# Patient Record
Sex: Male | Born: 1990
Health system: Southern US, Community
[De-identification: ages and names within clinical notes are randomized; demographics above are authoritative.]

## PROBLEM LIST (undated history)

## (undated) HISTORY — PX: TESTICLE TORSION REDUCTION: SHX795

---

## 2015-08-06 DIAGNOSIS — W3400XA Accidental discharge from unspecified firearms or gun, initial encounter: Secondary | ICD-10-CM

## 2015-08-06 HISTORY — DX: Accidental discharge from unspecified firearms or gun, initial encounter: W34.00XA

## 2015-12-22 ENCOUNTER — Inpatient Hospital Stay (HOSPITAL_COMMUNITY): Payer: Medicaid Other

## 2015-12-22 ENCOUNTER — Inpatient Hospital Stay (HOSPITAL_COMMUNITY): Payer: Medicaid Other | Admitting: Anesthesiology

## 2015-12-22 ENCOUNTER — Emergency Department (HOSPITAL_COMMUNITY): Payer: Medicaid Other

## 2015-12-22 ENCOUNTER — Encounter (HOSPITAL_COMMUNITY): Admission: EM | Disposition: A | Discharge status: Rehabilitation Facility | Payer: Self-pay | Source: Home / Self Care

## 2015-12-22 ENCOUNTER — Inpatient Hospital Stay (HOSPITAL_COMMUNITY)
Admission: EM | Admit: 2015-12-22 | Discharge: 2016-01-02 | DRG: 958 | Disposition: A | Discharge status: Rehabilitation Facility | Payer: Medicaid Other | Attending: General Surgery | Admitting: General Surgery

## 2015-12-22 ENCOUNTER — Encounter (HOSPITAL_COMMUNITY): Payer: Self-pay

## 2015-12-22 DIAGNOSIS — Z419 Encounter for procedure for purposes other than remedying health state, unspecified: Secondary | ICD-10-CM

## 2015-12-22 DIAGNOSIS — D696 Thrombocytopenia, unspecified: Secondary | ICD-10-CM | POA: Diagnosis not present

## 2015-12-22 DIAGNOSIS — G8220 Paraplegia, unspecified: Secondary | ICD-10-CM | POA: Diagnosis present

## 2015-12-22 DIAGNOSIS — D62 Acute posthemorrhagic anemia: Secondary | ICD-10-CM | POA: Diagnosis not present

## 2015-12-22 DIAGNOSIS — N319 Neuromuscular dysfunction of bladder, unspecified: Secondary | ICD-10-CM | POA: Diagnosis present

## 2015-12-22 DIAGNOSIS — S22029A Unspecified fracture of second thoracic vertebra, initial encounter for closed fracture: Secondary | ICD-10-CM | POA: Diagnosis present

## 2015-12-22 DIAGNOSIS — K5900 Constipation, unspecified: Secondary | ICD-10-CM | POA: Diagnosis not present

## 2015-12-22 DIAGNOSIS — S22029B Unspecified fracture of second thoracic vertebra, initial encounter for open fracture: Secondary | ICD-10-CM | POA: Diagnosis present

## 2015-12-22 DIAGNOSIS — F419 Anxiety disorder, unspecified: Secondary | ICD-10-CM | POA: Diagnosis present

## 2015-12-22 DIAGNOSIS — J9600 Acute respiratory failure, unspecified whether with hypoxia or hypercapnia: Secondary | ICD-10-CM

## 2015-12-22 DIAGNOSIS — S24151A Other incomplete lesion at T1 level of thoracic spinal cord, initial encounter: Secondary | ICD-10-CM | POA: Diagnosis present

## 2015-12-22 DIAGNOSIS — S41101A Unspecified open wound of right upper arm, initial encounter: Secondary | ICD-10-CM | POA: Diagnosis present

## 2015-12-22 DIAGNOSIS — S27321A Contusion of lung, unilateral, initial encounter: Secondary | ICD-10-CM | POA: Diagnosis present

## 2015-12-22 DIAGNOSIS — Z9889 Other specified postprocedural states: Secondary | ICD-10-CM | POA: Diagnosis not present

## 2015-12-22 DIAGNOSIS — S22019B Unspecified fracture of first thoracic vertebra, initial encounter for open fracture: Secondary | ICD-10-CM | POA: Diagnosis present

## 2015-12-22 DIAGNOSIS — S22019A Unspecified fracture of first thoracic vertebra, initial encounter for closed fracture: Secondary | ICD-10-CM | POA: Diagnosis present

## 2015-12-22 DIAGNOSIS — W3400XA Accidental discharge from unspecified firearms or gun, initial encounter: Secondary | ICD-10-CM

## 2015-12-22 DIAGNOSIS — E876 Hypokalemia: Secondary | ICD-10-CM | POA: Diagnosis not present

## 2015-12-22 DIAGNOSIS — S41131A Puncture wound without foreign body of right upper arm, initial encounter: Secondary | ICD-10-CM

## 2015-12-22 DIAGNOSIS — D72829 Elevated white blood cell count, unspecified: Secondary | ICD-10-CM | POA: Diagnosis not present

## 2015-12-22 DIAGNOSIS — Z880 Allergy status to penicillin: Secondary | ICD-10-CM

## 2015-12-22 DIAGNOSIS — S41139A Puncture wound without foreign body of unspecified upper arm, initial encounter: Secondary | ICD-10-CM

## 2015-12-22 DIAGNOSIS — S2231XA Fracture of one rib, right side, initial encounter for closed fracture: Secondary | ICD-10-CM | POA: Diagnosis present

## 2015-12-22 DIAGNOSIS — T1490XA Injury, unspecified, initial encounter: Secondary | ICD-10-CM

## 2015-12-22 DIAGNOSIS — S272XXA Traumatic hemopneumothorax, initial encounter: Secondary | ICD-10-CM | POA: Diagnosis present

## 2015-12-22 DIAGNOSIS — M5489 Other dorsalgia: Secondary | ICD-10-CM | POA: Diagnosis present

## 2015-12-22 DIAGNOSIS — R0682 Tachypnea, not elsewhere classified: Secondary | ICD-10-CM

## 2015-12-22 HISTORY — PX: THORACIC LAMINECTOMY FOR EPIDURAL ABSCESS: SHX6115

## 2015-12-22 LAB — COMPREHENSIVE METABOLIC PANEL
ALT: 13 U/L — AB (ref 17–63)
ANION GAP: 8 (ref 5–15)
AST: 20 U/L (ref 15–41)
Albumin: 3 g/dL — ABNORMAL LOW (ref 3.5–5.0)
Alkaline Phosphatase: 42 U/L (ref 38–126)
BUN: 11 mg/dL (ref 6–20)
CHLORIDE: 117 mmol/L — AB (ref 101–111)
CO2: 18 mmol/L — ABNORMAL LOW (ref 22–32)
CREATININE: 0.63 mg/dL (ref 0.61–1.24)
Calcium: 7.4 mg/dL — ABNORMAL LOW (ref 8.9–10.3)
Glucose, Bld: 93 mg/dL (ref 65–99)
POTASSIUM: 3.3 mmol/L — AB (ref 3.5–5.1)
SODIUM: 143 mmol/L (ref 135–145)
Total Bilirubin: 0.6 mg/dL (ref 0.3–1.2)
Total Protein: 5.3 g/dL — ABNORMAL LOW (ref 6.5–8.1)

## 2015-12-22 LAB — URINALYSIS, ROUTINE W REFLEX MICROSCOPIC
BILIRUBIN URINE: NEGATIVE
GLUCOSE, UA: NEGATIVE mg/dL
Hgb urine dipstick: NEGATIVE
KETONES UR: NEGATIVE mg/dL
Leukocytes, UA: NEGATIVE
NITRITE: NEGATIVE
PH: 7 (ref 5.0–8.0)
Protein, ur: NEGATIVE mg/dL
Specific Gravity, Urine: >1.046 — ABNORMAL HIGH (ref 1.005–1.030)

## 2015-12-22 LAB — I-STAT CG4 LACTIC ACID, ED: Lactic Acid, Venous: 2.12 mmol/L (ref 0.5–2.0)

## 2015-12-22 LAB — MRSA PCR SCREENING: MRSA BY PCR: NEGATIVE

## 2015-12-22 LAB — PROTIME-INR
INR: 1.08 (ref 0.00–1.49)
Prothrombin Time: 14.2 seconds (ref 11.6–15.2)

## 2015-12-22 LAB — I-STAT CHEM 8, ED
BUN: 13 mg/dL (ref 6–20)
CALCIUM ION: 0.88 mmol/L — AB (ref 1.12–1.23)
CREATININE: 0.5 mg/dL — AB (ref 0.61–1.24)
Chloride: 113 mmol/L — ABNORMAL HIGH (ref 101–111)
GLUCOSE: 89 mg/dL (ref 65–99)
HCT: 43 % (ref 39.0–52.0)
HEMOGLOBIN: 14.6 g/dL (ref 13.0–17.0)
Potassium: 3.2 mmol/L — ABNORMAL LOW (ref 3.5–5.1)
Sodium: 146 mmol/L — ABNORMAL HIGH (ref 135–145)
TCO2: 15 mmol/L (ref 0–100)

## 2015-12-22 LAB — POCT I-STAT 7, (LYTES, BLD GAS, ICA,H+H)
BICARBONATE: 24.4 meq/L — AB (ref 20.0–24.0)
Calcium, Ion: 1.17 mmol/L (ref 1.12–1.23)
HCT: 42 % (ref 39.0–52.0)
Hemoglobin: 14.3 g/dL (ref 13.0–17.0)
O2 SAT: 100 %
PH ART: 7.408 (ref 7.350–7.450)
PO2 ART: 196 mmHg — AB (ref 80.0–100.0)
POTASSIUM: 4.1 mmol/L (ref 3.5–5.1)
SODIUM: 140 mmol/L (ref 135–145)
TCO2: 26 mmol/L (ref 0–100)
pCO2 arterial: 38.5 mmHg (ref 35.0–45.0)

## 2015-12-22 LAB — PREPARE FRESH FROZEN PLASMA
UNIT DIVISION: 0
Unit division: 0

## 2015-12-22 LAB — CBC
HCT: 41.3 % (ref 39.0–52.0)
Hemoglobin: 13.7 g/dL (ref 13.0–17.0)
MCH: 30.5 pg (ref 26.0–34.0)
MCHC: 33.2 g/dL (ref 30.0–36.0)
MCV: 92 fL (ref 78.0–100.0)
PLATELETS: 153 10*3/uL (ref 150–400)
RBC: 4.49 MIL/uL (ref 4.22–5.81)
RDW: 13.4 % (ref 11.5–15.5)
WBC: 7.9 10*3/uL (ref 4.0–10.5)

## 2015-12-22 LAB — ETHANOL: Alcohol, Ethyl (B): <5 mg/dL (ref <5)

## 2015-12-22 LAB — ABO/RH: ABO/RH(D): B POS

## 2015-12-22 SURGERY — THORACIC LAMINECTOMY FOR EPIDURAL ABSCESS
Anesthesia: General | Site: Back | Laterality: Right

## 2015-12-22 MED ORDER — BACITRACIN ZINC 500 UNIT/GM EX OINT
TOPICAL_OINTMENT | Freq: Two times a day (BID) | CUTANEOUS | Status: DC
  Administered 2015-12-23: 1 via TOPICAL
  Administered 2015-12-23: 11:00:00 via TOPICAL
  Administered 2015-12-24: 1 via TOPICAL
  Administered 2015-12-24 – 2015-12-29 (×9): via TOPICAL
  Administered 2015-12-29: 1 via TOPICAL
  Administered 2015-12-30: 10:00:00 via TOPICAL
  Administered 2015-12-30: 1 via TOPICAL
  Administered 2015-12-31 – 2016-01-02 (×4): via TOPICAL
  Filled 2015-12-22 (×2): qty 28.35

## 2015-12-22 MED ORDER — SUCCINYLCHOLINE CHLORIDE 200 MG/10ML IV SOSY
PREFILLED_SYRINGE | INTRAVENOUS | Status: AC
  Filled 2015-12-22: qty 10

## 2015-12-22 MED ORDER — HYDROMORPHONE HCL 1 MG/ML IJ SOLN
0.5000 mg | INTRAMUSCULAR | Status: DC | PRN
  Administered 2015-12-22 – 2016-01-02 (×9): 0.5 mg via INTRAVENOUS
  Filled 2015-12-22 (×10): qty 1

## 2015-12-22 MED ORDER — SUCCINYLCHOLINE CHLORIDE 20 MG/ML IJ SOLN
INTRAMUSCULAR | Status: DC | PRN
  Administered 2015-12-22: 130 mg via INTRAVENOUS

## 2015-12-22 MED ORDER — ONDANSETRON HCL 4 MG/2ML IJ SOLN
INTRAMUSCULAR | Status: DC | PRN
  Administered 2015-12-22: 4 mg via INTRAVENOUS

## 2015-12-22 MED ORDER — MORPHINE SULFATE (PF) 2 MG/ML IV SOLN
INTRAVENOUS | Status: AC
  Administered 2015-12-22: 2 mg via INTRAVENOUS
  Filled 2015-12-22: qty 1

## 2015-12-22 MED ORDER — ONDANSETRON HCL 4 MG/2ML IJ SOLN
INTRAMUSCULAR | Status: AC
  Filled 2015-12-22: qty 2

## 2015-12-22 MED ORDER — FENTANYL CITRATE (PF) 250 MCG/5ML IJ SOLN
INTRAMUSCULAR | Status: AC
  Filled 2015-12-22: qty 5

## 2015-12-22 MED ORDER — PANTOPRAZOLE SODIUM 40 MG IV SOLR
40.0000 mg | Freq: Every day | INTRAVENOUS | Status: DC
  Administered 2015-12-23 – 2015-12-24 (×2): 40 mg via INTRAVENOUS
  Filled 2015-12-22 (×2): qty 40

## 2015-12-22 MED ORDER — ALBUTEROL SULFATE HFA 108 (90 BASE) MCG/ACT IN AERS
INHALATION_SPRAY | RESPIRATORY_TRACT | Status: DC | PRN
  Administered 2015-12-22: 2 via RESPIRATORY_TRACT

## 2015-12-22 MED ORDER — ONDANSETRON HCL 4 MG/2ML IJ SOLN
4.0000 mg | Freq: Four times a day (QID) | INTRAMUSCULAR | Status: DC | PRN
  Filled 2015-12-22: qty 2

## 2015-12-22 MED ORDER — VANCOMYCIN HCL IN DEXTROSE 1-5 GM/200ML-% IV SOLN
INTRAVENOUS | Status: AC
  Filled 2015-12-22: qty 200

## 2015-12-22 MED ORDER — HYDROMORPHONE HCL 1 MG/ML IJ SOLN
2.0000 mg | Freq: Once | INTRAMUSCULAR | Status: AC
  Administered 2015-12-22: 2 mg via INTRAVENOUS
  Filled 2015-12-22: qty 2

## 2015-12-22 MED ORDER — TETANUS-DIPHTH-ACELL PERTUSSIS 5-2.5-18.5 LF-MCG/0.5 IM SUSP
0.5000 mL | Freq: Once | INTRAMUSCULAR | Status: AC
Start: 2015-12-22 — End: 2015-12-22
  Administered 2015-12-22: 0.5 mL via INTRAMUSCULAR

## 2015-12-22 MED ORDER — 0.9 % SODIUM CHLORIDE (POUR BTL) OPTIME
TOPICAL | Status: DC | PRN
  Administered 2015-12-22: 1000 mL

## 2015-12-22 MED ORDER — MIDAZOLAM HCL 5 MG/5ML IJ SOLN
INTRAMUSCULAR | Status: DC | PRN
  Administered 2015-12-22: 2 mg via INTRAVENOUS

## 2015-12-22 MED ORDER — DOCUSATE SODIUM 100 MG PO CAPS
100.0000 mg | ORAL_CAPSULE | Freq: Two times a day (BID) | ORAL | Status: DC
  Administered 2015-12-24 – 2016-01-02 (×20): 100 mg via ORAL
  Filled 2015-12-22 (×20): qty 1

## 2015-12-22 MED ORDER — GABAPENTIN 300 MG PO CAPS
300.0000 mg | ORAL_CAPSULE | Freq: Three times a day (TID) | ORAL | Status: DC
  Administered 2015-12-24 – 2015-12-27 (×10): 300 mg via ORAL
  Filled 2015-12-22 (×10): qty 1

## 2015-12-22 MED ORDER — PHENYLEPHRINE 40 MCG/ML (10ML) SYRINGE FOR IV PUSH (FOR BLOOD PRESSURE SUPPORT)
PREFILLED_SYRINGE | INTRAVENOUS | Status: AC
  Filled 2015-12-22: qty 10

## 2015-12-22 MED ORDER — PROPOFOL 10 MG/ML IV BOLUS
INTRAVENOUS | Status: AC
  Filled 2015-12-22: qty 40

## 2015-12-22 MED ORDER — SODIUM CHLORIDE 0.9 % IV SOLN
INTRAVENOUS | Status: AC | PRN
  Administered 2015-12-22: 150 mL/h via INTRAVENOUS

## 2015-12-22 MED ORDER — BUPIVACAINE-EPINEPHRINE (PF) 0.5% -1:200000 IJ SOLN
INTRAMUSCULAR | Status: DC | PRN
  Administered 2015-12-22: 10 mL

## 2015-12-22 MED ORDER — POLYETHYLENE GLYCOL 3350 17 G PO PACK
17.0000 g | PACK | Freq: Every day | ORAL | Status: DC
  Administered 2015-12-24 – 2016-01-02 (×10): 17 g via ORAL
  Filled 2015-12-22 (×10): qty 1

## 2015-12-22 MED ORDER — CEFAZOLIN SODIUM-DEXTROSE 2-4 GM/100ML-% IV SOLN
2.0000 g | Freq: Once | INTRAVENOUS | Status: AC
  Administered 2015-12-22: 2 g via INTRAVENOUS

## 2015-12-22 MED ORDER — HYDROMORPHONE HCL 1 MG/ML IJ SOLN
INTRAMUSCULAR | Status: AC
  Filled 2015-12-22: qty 1

## 2015-12-22 MED ORDER — ONDANSETRON HCL 4 MG PO TABS: 4.0000 mg | ORAL_TABLET | Freq: Four times a day (QID) | ORAL | Status: DC | PRN

## 2015-12-22 MED ORDER — PANTOPRAZOLE SODIUM 40 MG PO TBEC
40.0000 mg | DELAYED_RELEASE_TABLET | Freq: Every day | ORAL | Status: DC
  Administered 2015-12-25: 40 mg via ORAL
  Filled 2015-12-22: qty 1

## 2015-12-22 MED ORDER — PHENYLEPHRINE HCL 10 MG/ML IJ SOLN
INTRAMUSCULAR | Status: DC | PRN
Start: 2015-12-22 — End: 2015-12-22
  Administered 2015-12-22 (×2): 80 ug via INTRAVENOUS

## 2015-12-22 MED ORDER — MORPHINE SULFATE (PF) 2 MG/ML IV SOLN
2.0000 mg | Freq: Once | INTRAVENOUS | Status: AC
  Administered 2015-12-22: 2 mg via INTRAVENOUS

## 2015-12-22 MED ORDER — FENTANYL CITRATE (PF) 100 MCG/2ML IJ SOLN
INTRAMUSCULAR | Status: DC | PRN
  Administered 2015-12-22 (×3): 50 ug via INTRAVENOUS
  Administered 2015-12-22: 100 ug via INTRAVENOUS
  Administered 2015-12-22 (×2): 50 ug via INTRAVENOUS
  Administered 2015-12-22: 100 ug via INTRAVENOUS
  Administered 2015-12-22: 50 ug via INTRAVENOUS

## 2015-12-22 MED ORDER — VANCOMYCIN HCL 1000 MG IV SOLR
1000.0000 mg | INTRAVENOUS | Status: DC | PRN
  Administered 2015-12-22: 1000 mg via INTRAVENOUS

## 2015-12-22 MED ORDER — OXYCODONE HCL 5 MG PO TABS
5.0000 mg | ORAL_TABLET | ORAL | Status: DC | PRN
  Administered 2015-12-24: 15 mg via ORAL
  Administered 2015-12-24: 10 mg via ORAL
  Administered 2015-12-25 – 2015-12-26 (×7): 15 mg via ORAL
  Administered 2015-12-27: 5 mg via ORAL
  Administered 2015-12-27: 15 mg via ORAL
  Administered 2015-12-27: 10 mg via ORAL
  Administered 2015-12-28: 15 mg via ORAL
  Filled 2015-12-22 (×4): qty 3
  Filled 2015-12-22: qty 2
  Filled 2015-12-22 (×4): qty 3
  Filled 2015-12-22: qty 2
  Filled 2015-12-22: qty 1
  Filled 2015-12-22 (×2): qty 3

## 2015-12-22 MED ORDER — THROMBIN 5000 UNITS EX SOLR
OROMUCOSAL | Status: DC | PRN
  Administered 2015-12-22: 50 mL via TOPICAL

## 2015-12-22 MED ORDER — POTASSIUM CHLORIDE IN NACL 20-0.45 MEQ/L-% IV SOLN
INTRAVENOUS | Status: DC
  Administered 2015-12-22 – 2015-12-25 (×5): via INTRAVENOUS
  Filled 2015-12-22 (×9): qty 1000

## 2015-12-22 MED ORDER — ROCURONIUM BROMIDE 50 MG/5ML IV SOLN
INTRAVENOUS | Status: AC
  Filled 2015-12-22: qty 3

## 2015-12-22 MED ORDER — THROMBIN 20000 UNITS EX SOLR
CUTANEOUS | Status: DC | PRN
  Administered 2015-12-22: 20 mL via TOPICAL

## 2015-12-22 MED ORDER — PHENYLEPHRINE HCL 10 MG/ML IJ SOLN
10.0000 mg | INTRAVENOUS | Status: DC | PRN
  Administered 2015-12-22: 100 ug/min via INTRAVENOUS

## 2015-12-22 MED ORDER — MIDAZOLAM HCL 2 MG/2ML IJ SOLN
INTRAMUSCULAR | Status: AC
  Filled 2015-12-22: qty 2

## 2015-12-22 MED ORDER — LIDOCAINE HCL (CARDIAC) 20 MG/ML IV SOLN
INTRAVENOUS | Status: DC | PRN
  Administered 2015-12-22: 60 mg via INTRAVENOUS

## 2015-12-22 MED ORDER — CEFAZOLIN SODIUM-DEXTROSE 2-4 GM/100ML-% IV SOLN
INTRAVENOUS | Status: AC
  Filled 2015-12-22: qty 100

## 2015-12-22 MED ORDER — IOPAMIDOL (ISOVUE-300) INJECTION 61%
INTRAVENOUS | Status: AC
  Filled 2015-12-22: qty 100

## 2015-12-22 MED ORDER — PROPOFOL 10 MG/ML IV BOLUS
INTRAVENOUS | Status: DC | PRN
  Administered 2015-12-22: 150 mg via INTRAVENOUS
  Administered 2015-12-22: 50 mg via INTRAVENOUS

## 2015-12-22 MED ORDER — ARTIFICIAL TEARS OP OINT
TOPICAL_OINTMENT | OPHTHALMIC | Status: DC | PRN
Start: 2015-12-22 — End: 2015-12-22
  Administered 2015-12-22: 1 via OPHTHALMIC

## 2015-12-22 MED ORDER — LACTATED RINGERS IV SOLN
INTRAVENOUS | Status: DC | PRN
  Administered 2015-12-22 (×3): via INTRAVENOUS

## 2015-12-22 MED ORDER — IOPAMIDOL (ISOVUE-300) INJECTION 61%
100.0000 mL | Freq: Once | INTRAVENOUS | Status: AC | PRN
  Administered 2015-12-22: 100 mL via INTRAVENOUS

## 2015-12-22 MED ORDER — ROCURONIUM BROMIDE 100 MG/10ML IV SOLN
INTRAVENOUS | Status: DC | PRN
  Administered 2015-12-22: 30 mg via INTRAVENOUS
  Administered 2015-12-22: 50 mg via INTRAVENOUS
  Administered 2015-12-22: 30 mg via INTRAVENOUS
  Administered 2015-12-22: 20 mg via INTRAVENOUS

## 2015-12-22 MED ORDER — PROPOFOL 500 MG/50ML IV EMUL
INTRAVENOUS | Status: DC | PRN
  Administered 2015-12-22: 50 ug/kg/min via INTRAVENOUS

## 2015-12-22 SURGICAL SUPPLY — 56 items
BENZOIN TINCTURE PRP APPL 2/3 (GAUZE/BANDAGES/DRESSINGS) ×2 IMPLANT
BLADE CLIPPER SURG (BLADE) IMPLANT
BUR ACORN 6.0 (BURR) ×2 IMPLANT
BUR ACORN 6.0 ACORN (BURR) ×2 IMPLANT
BUR MATCHSTICK NEURO 3.0 LAGG (BURR) ×2 IMPLANT
CANISTER SUCT 3000ML PPV (MISCELLANEOUS) ×2 IMPLANT
DRAPE C-ARM 42X72 X-RAY (DRAPES) ×6 IMPLANT
DRAPE LAPAROTOMY 100X72X124 (DRAPES) ×2 IMPLANT
DRAPE MICROSCOPE LEICA (MISCELLANEOUS) ×2 IMPLANT
DRAPE POUCH INSTRU U-SHP 10X18 (DRAPES) ×2 IMPLANT
DRSG OPSITE POSTOP 4X8 (GAUZE/BANDAGES/DRESSINGS) ×2 IMPLANT
DRSG PAD ABDOMINAL 8X10 ST (GAUZE/BANDAGES/DRESSINGS) IMPLANT
DURAPREP 26ML APPLICATOR (WOUND CARE) ×2 IMPLANT
ELECT REM PT RETURN 9FT ADLT (ELECTROSURGICAL) ×2
ELECTRODE REM PT RTRN 9FT ADLT (ELECTROSURGICAL) ×1 IMPLANT
GAUZE SPONGE 4X4 12PLY STRL (GAUZE/BANDAGES/DRESSINGS) ×2 IMPLANT
GAUZE SPONGE 4X4 16PLY XRAY LF (GAUZE/BANDAGES/DRESSINGS) IMPLANT
GLOVE BIO SURGEON STRL SZ 6.5 (GLOVE) ×2 IMPLANT
GLOVE BIO SURGEON STRL SZ7 (GLOVE) ×2 IMPLANT
GLOVE BIOGEL M 8.0 STRL (GLOVE) ×4 IMPLANT
GLOVE EXAM NITRILE LRG STRL (GLOVE) IMPLANT
GLOVE EXAM NITRILE MD LF STRL (GLOVE) IMPLANT
GLOVE EXAM NITRILE XL STR (GLOVE) IMPLANT
GLOVE EXAM NITRILE XS STR PU (GLOVE) IMPLANT
GLOVE INDICATOR 6.5 STRL GRN (GLOVE) ×2 IMPLANT
GLOVE INDICATOR 7.5 STRL GRN (GLOVE) ×2 IMPLANT
GLOVE INDICATOR 8.5 STRL (GLOVE) ×2 IMPLANT
GOWN STRL REUS W/ TWL LRG LVL3 (GOWN DISPOSABLE) ×3 IMPLANT
GOWN STRL REUS W/ TWL XL LVL3 (GOWN DISPOSABLE) IMPLANT
GOWN STRL REUS W/TWL 2XL LVL3 (GOWN DISPOSABLE) IMPLANT
GOWN STRL REUS W/TWL LRG LVL3 (GOWN DISPOSABLE) ×3
GOWN STRL REUS W/TWL XL LVL3 (GOWN DISPOSABLE)
KIT BASIN OR (CUSTOM PROCEDURE TRAY) ×2 IMPLANT
KIT INFUSE SMALL (Orthopedic Implant) ×2 IMPLANT
KIT ROOM TURNOVER OR (KITS) ×2 IMPLANT
NEEDLE HYPO 18GX1.5 BLUNT FILL (NEEDLE) IMPLANT
NEEDLE HYPO 21X1.5 SAFETY (NEEDLE) ×2 IMPLANT
NEEDLE HYPO 25X1 1.5 SAFETY (NEEDLE) ×2 IMPLANT
NEEDLE SPNL 20GX3.5 QUINCKE YW (NEEDLE) ×2 IMPLANT
NS IRRIG 1000ML POUR BTL (IV SOLUTION) ×2 IMPLANT
PACK LAMINECTOMY NEURO (CUSTOM PROCEDURE TRAY) ×2 IMPLANT
PAD ARMBOARD 7.5X6 YLW CONV (MISCELLANEOUS) ×6 IMPLANT
PATTIES SURGICAL .5 X1 (DISPOSABLE) IMPLANT
RUBBERBAND STERILE (MISCELLANEOUS) ×4 IMPLANT
SPONGE LAP 4X18 X RAY DECT (DISPOSABLE) IMPLANT
SPONGE SURGIFOAM ABS GEL SZ50 (HEMOSTASIS) ×2 IMPLANT
STAPLER VISISTAT 35W (STAPLE) ×2 IMPLANT
STRIP CLOSURE SKIN 1/2X4 (GAUZE/BANDAGES/DRESSINGS) ×2 IMPLANT
SUT VIC AB 0 CT1 18XCR BRD8 (SUTURE) ×1 IMPLANT
SUT VIC AB 0 CT1 8-18 (SUTURE) ×1
SUT VIC AB 2-0 CP2 18 (SUTURE) ×2 IMPLANT
SUT VIC AB 3-0 SH 8-18 (SUTURE) ×2 IMPLANT
SYR 5ML LL (SYRINGE) IMPLANT
TOWEL OR 17X24 6PK STRL BLUE (TOWEL DISPOSABLE) ×2 IMPLANT
TOWEL OR 17X26 10 PK STRL BLUE (TOWEL DISPOSABLE) ×2 IMPLANT
WATER STERILE IRR 1000ML POUR (IV SOLUTION) ×2 IMPLANT
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
## 2015-12-22 NOTE — ED Notes (Signed)
Pt returned back to room from CT without incident.
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
## 2015-12-22 NOTE — ED Notes (Signed)
[facility] spoke with pt and called mother and significant other to inform that pt is in  Hospital[facility]. Erline LevineMichael I Terricka Onofrio

## 2015-12-22 NOTE — Anesthesia Preprocedure Evaluation (Addendum)
Anesthesia Evaluation  Patient identified by MRN, date of birth, ID band Patient awake    Reviewed: Allergy & Precautions, NPO status , Patient's Chart, lab work & pertinent test results  Airway Mallampati: II  TM Distance: >3 FB     Dental  (+) Teeth Intact   Pulmonary     + decreased breath sounds      Cardiovascular  Rhythm:Regular Rate:Normal     Neuro/Psych    GI/Hepatic   Endo/Other    Renal/GU      Musculoskeletal   Abdominal   Peds  Hematology   Anesthesia Other Findings   Reproductive/Obstetrics                            Anesthesia Physical Anesthesia Plan  ASA: IV and emergent  Anesthesia Plan: General   Post-op Pain Management:    Induction: Intravenous  Airway Management Planned: Oral ETT  Additional Equipment: Arterial line and CVP  Intra-op Plan:   Post-operative Plan:   Informed Consent: I have reviewed the patients History and Physical, chart, labs and discussed the procedure including the risks, benefits and alternatives for the proposed anesthesia with the patient or authorized representative who has indicated his/her understanding and acceptance.     Plan Discussed with: CRNA and Anesthesiologist  Anesthesia Plan Comments:         Anesthesia Quick Evaluation
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
## 2015-12-22 NOTE — ED Notes (Signed)
Pt transported to CT scan with this RN.  

## 2015-12-23 ENCOUNTER — Inpatient Hospital Stay (HOSPITAL_COMMUNITY): Payer: Medicaid Other

## 2015-12-23 LAB — TYPE AND SCREEN
ABO/RH(D): B POS
ANTIBODY SCREEN: NEGATIVE
UNIT DIVISION: 0
UNIT DIVISION: 0

## 2015-12-23 LAB — CBC
HEMATOCRIT: 37.5 % — AB (ref 39.0–52.0)
HEMOGLOBIN: 12.4 g/dL — AB (ref 13.0–17.0)
MCH: 30.7 pg (ref 26.0–34.0)
MCHC: 33.1 g/dL (ref 30.0–36.0)
MCV: 92.8 fL (ref 78.0–100.0)
PLATELETS: 149 10*3/uL — AB (ref 150–400)
RBC: 4.04 MIL/uL — AB (ref 4.22–5.81)
RDW: 13.7 % (ref 11.5–15.5)
WBC: 14.6 10*3/uL — AB (ref 4.0–10.5)

## 2015-12-23 LAB — BASIC METABOLIC PANEL
Anion gap: 8 (ref 5–15)
BUN: 9 mg/dL (ref 6–20)
CALCIUM: 8.3 mg/dL — AB (ref 8.9–10.3)
CHLORIDE: 105 mmol/L (ref 101–111)
CO2: 27 mmol/L (ref 22–32)
CREATININE: 0.74 mg/dL (ref 0.61–1.24)
Glucose, Bld: 104 mg/dL — ABNORMAL HIGH (ref 65–99)
Potassium: 3.8 mmol/L (ref 3.5–5.1)
Sodium: 140 mmol/L (ref 135–145)

## 2015-12-23 LAB — TRIGLYCERIDES: Triglycerides: 61 mg/dL (ref <150)

## 2015-12-23 LAB — BLOOD PRODUCT ORDER (VERBAL) VERIFICATION

## 2015-12-23 MED ORDER — ANTISEPTIC ORAL RINSE SOLUTION (CORINZ)
7.0000 mL | OROMUCOSAL | Status: DC
  Administered 2015-12-23 – 2015-12-24 (×15): 7 mL via OROMUCOSAL

## 2015-12-23 MED ORDER — SODIUM CHLORIDE 0.9 % IV SOLN
250.0000 mL | INTRAVENOUS | Status: DC
Start: 2015-12-23 — End: 2015-12-27

## 2015-12-23 MED ORDER — CHLORHEXIDINE GLUCONATE 0.12% ORAL RINSE (MEDLINE KIT)
15.0000 mL | Freq: Two times a day (BID) | OROMUCOSAL | Status: DC
  Administered 2015-12-23 – 2015-12-24 (×4): 15 mL via OROMUCOSAL

## 2015-12-23 MED ORDER — SODIUM CHLORIDE 0.9% FLUSH
3.0000 mL | INTRAVENOUS | Status: DC | PRN
  Administered 2015-12-26: 3 mL via INTRAVENOUS
  Filled 2015-12-23: qty 3

## 2015-12-23 MED ORDER — SODIUM CHLORIDE 0.9% FLUSH
3.0000 mL | Freq: Two times a day (BID) | INTRAVENOUS | Status: DC
  Administered 2015-12-23: 9 mL via INTRAVENOUS
  Administered 2015-12-23 – 2015-12-24 (×2): 3 mL via INTRAVENOUS
  Administered 2015-12-24: 9 mL via INTRAVENOUS
  Administered 2015-12-25 – 2015-12-26 (×2): 3 mL via INTRAVENOUS

## 2015-12-23 MED ORDER — PHENOL 1.4 % MT LIQD: 1.0000 | OROMUCOSAL | Status: DC | PRN

## 2015-12-23 MED ORDER — SODIUM CHLORIDE 0.9 % IV SOLN: INTRAVENOUS | Status: DC

## 2015-12-23 MED ORDER — VANCOMYCIN HCL 10 G IV SOLR
1500.0000 mg | Freq: Two times a day (BID) | INTRAVENOUS | Status: AC
  Administered 2015-12-23 – 2015-12-26 (×8): 1500 mg via INTRAVENOUS
  Filled 2015-12-23 (×9): qty 1500

## 2015-12-23 MED ORDER — ONDANSETRON HCL 4 MG/2ML IJ SOLN: 4.0000 mg | INTRAMUSCULAR | Status: DC | PRN

## 2015-12-23 MED ORDER — PROPOFOL 1000 MG/100ML IV EMUL
5.0000 ug/kg/min | INTRAVENOUS | Status: DC
  Administered 2015-12-23: 60 ug/kg/min via INTRAVENOUS
  Administered 2015-12-23: 65 ug/kg/min via INTRAVENOUS
  Administered 2015-12-23: 70 ug/kg/min via INTRAVENOUS
  Administered 2015-12-23: 50 ug/kg/min via INTRAVENOUS
  Administered 2015-12-23: 70 ug/kg/min via INTRAVENOUS
  Administered 2015-12-23: 65 ug/kg/min via INTRAVENOUS
  Administered 2015-12-23: 70 ug/kg/min via INTRAVENOUS
  Administered 2015-12-23: 65 ug/kg/min via INTRAVENOUS
  Administered 2015-12-23 – 2015-12-24 (×5): 70 ug/kg/min via INTRAVENOUS
  Filled 2015-12-23 (×12): qty 100

## 2015-12-23 MED ORDER — ACETAMINOPHEN 325 MG PO TABS
650.0000 mg | ORAL_TABLET | ORAL | Status: DC | PRN
  Administered 2015-12-25 – 2015-12-27 (×5): 650 mg via ORAL
  Filled 2015-12-23 (×5): qty 2

## 2015-12-23 MED ORDER — ACETAMINOPHEN 650 MG RE SUPP
650.0000 mg | RECTAL | Status: DC | PRN
  Administered 2015-12-24: 650 mg via RECTAL
  Filled 2015-12-23: qty 1

## 2015-12-23 MED ORDER — MENTHOL 3 MG MT LOZG
1.0000 | LOZENGE | OROMUCOSAL | Status: DC | PRN
  Filled 2015-12-23: qty 9

## 2015-12-23 MED ORDER — HYDROMORPHONE HCL 1 MG/ML IJ SOLN
1.0000 mg | INTRAMUSCULAR | Status: DC | PRN
  Administered 2015-12-23 – 2015-12-24 (×15): 1 mg via INTRAVENOUS
  Filled 2015-12-23 (×15): qty 1
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
## 2015-12-24 MED ORDER — LORAZEPAM 2 MG/ML IJ SOLN: 0.5000 mg | Freq: Three times a day (TID) | INTRAMUSCULAR | Status: DC | PRN

## 2015-12-24 MED ORDER — IPRATROPIUM-ALBUTEROL 0.5-2.5 (3) MG/3ML IN SOLN
3.0000 mL | Freq: Four times a day (QID) | RESPIRATORY_TRACT | Status: DC
  Administered 2015-12-24 (×3): 3 mL via RESPIRATORY_TRACT
  Filled 2015-12-24 (×3): qty 3

## 2015-12-24 MED ORDER — CIPROFLOXACIN IN D5W 400 MG/200ML IV SOLN
400.0000 mg | Freq: Two times a day (BID) | INTRAVENOUS | Status: AC
  Administered 2015-12-24 – 2015-12-26 (×6): 400 mg via INTRAVENOUS
  Filled 2015-12-24 (×6): qty 200

## 2015-12-24 MED ORDER — CETYLPYRIDINIUM CHLORIDE 0.05 % MT LIQD
7.0000 mL | Freq: Two times a day (BID) | OROMUCOSAL | Status: DC
  Administered 2015-12-24 – 2016-01-02 (×18): 7 mL via OROMUCOSAL

## 2015-12-24 NOTE — Progress Notes (Signed)
Patient was able to self extubate during the weaning process. Oxygen, at 4 L/min, administered via [place] and SpO2 reading >93%. Lungs CTA, diminished in bases. Dr. Donell BeersByerly contacted concerning event. Patient is neurologically intact. Will continue to monitor.

## 2015-12-24 NOTE — Progress Notes (Signed)
Patient ID: Matthew SmartJonathan [name], male   DOB: March 16, 1991, 25 y.o.   MRN: [id number] Extubated c/o being hungry. Able to move some the arms
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
## 2015-12-25 ENCOUNTER — Encounter (HOSPITAL_COMMUNITY): Payer: Self-pay | Admitting: Physical Medicine and Rehabilitation

## 2015-12-25 DIAGNOSIS — S272XXD Traumatic hemopneumothorax, subsequent encounter: Secondary | ICD-10-CM

## 2015-12-25 DIAGNOSIS — W3400XD Accidental discharge from unspecified firearms or gun, subsequent encounter: Secondary | ICD-10-CM

## 2015-12-25 DIAGNOSIS — T1490XA Injury, unspecified, initial encounter: Secondary | ICD-10-CM

## 2015-12-25 DIAGNOSIS — R0682 Tachypnea, not elsewhere classified: Secondary | ICD-10-CM

## 2015-12-25 DIAGNOSIS — G8220 Paraplegia, unspecified: Secondary | ICD-10-CM | POA: Diagnosis present

## 2015-12-25 DIAGNOSIS — Z9889 Other specified postprocedural states: Secondary | ICD-10-CM | POA: Diagnosis not present

## 2015-12-25 DIAGNOSIS — S272XXA Traumatic hemopneumothorax, initial encounter: Secondary | ICD-10-CM | POA: Diagnosis present

## 2015-12-25 DIAGNOSIS — T1490 Injury, unspecified: Secondary | ICD-10-CM

## 2015-12-25 DIAGNOSIS — D62 Acute posthemorrhagic anemia: Secondary | ICD-10-CM | POA: Diagnosis not present

## 2015-12-25 DIAGNOSIS — D72829 Elevated white blood cell count, unspecified: Secondary | ICD-10-CM | POA: Diagnosis not present

## 2015-12-25 DIAGNOSIS — S41101D Unspecified open wound of right upper arm, subsequent encounter: Secondary | ICD-10-CM

## 2015-12-25 DIAGNOSIS — Z419 Encounter for procedure for purposes other than remedying health state, unspecified: Secondary | ICD-10-CM

## 2015-12-25 DIAGNOSIS — D696 Thrombocytopenia, unspecified: Secondary | ICD-10-CM | POA: Diagnosis not present

## 2015-12-25 LAB — BASIC METABOLIC PANEL
ANION GAP: 6 (ref 5–15)
BUN: <5 mg/dL — ABNORMAL LOW (ref 6–20)
CALCIUM: 8.5 mg/dL — AB (ref 8.9–10.3)
CO2: 29 mmol/L (ref 22–32)
CREATININE: 0.8 mg/dL (ref 0.61–1.24)
Chloride: 105 mmol/L (ref 101–111)
GLUCOSE: 104 mg/dL — AB (ref 65–99)
Potassium: 3.7 mmol/L (ref 3.5–5.1)
Sodium: 140 mmol/L (ref 135–145)

## 2015-12-25 MED ORDER — IPRATROPIUM-ALBUTEROL 0.5-2.5 (3) MG/3ML IN SOLN: 3.0000 mL | RESPIRATORY_TRACT | Status: DC | PRN

## 2015-12-25 MED ORDER — HYDROMORPHONE HCL 1 MG/ML IJ SOLN: 1.0000 mg | INTRAMUSCULAR | Status: DC | PRN
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
## 2015-12-25 NOTE — Progress Notes (Signed)
Patient ID: Matthew SmartJonathan [name], male   DOB: May 17, 1991, 25 y.o.   MRN: [id number] [name], moves both arms. C/o burning sensation. Seen by rehab

## 2015-12-25 NOTE — Progress Notes (Signed)
Rehab admissions - I am following for potential inpatient rehab admission.  Please see rehab consult done by Dr. Allena KatzPatel today.  Call me for questions.  #[phone]

## 2015-12-25 NOTE — Progress Notes (Signed)
Arrived from [place]. Alert and oriented. C/o of chest and abdomen pain 8/10. Oriented to room. Call light within reach.
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
## 2015-12-26 ENCOUNTER — Encounter (HOSPITAL_COMMUNITY): Payer: Self-pay | Admitting: Neurosurgery

## 2015-12-26 DIAGNOSIS — S2231XA Fracture of one rib, right side, initial encounter for closed fracture: Secondary | ICD-10-CM | POA: Diagnosis present

## 2015-12-26 DIAGNOSIS — S22019A Unspecified fracture of first thoracic vertebra, initial encounter for closed fracture: Secondary | ICD-10-CM | POA: Diagnosis present

## 2015-12-26 DIAGNOSIS — W3400XA Accidental discharge from unspecified firearms or gun, initial encounter: Secondary | ICD-10-CM

## 2015-12-26 DIAGNOSIS — S22029A Unspecified fracture of second thoracic vertebra, initial encounter for closed fracture: Secondary | ICD-10-CM | POA: Diagnosis present

## 2015-12-26 DIAGNOSIS — S41131A Puncture wound without foreign body of right upper arm, initial encounter: Secondary | ICD-10-CM

## 2015-12-26 MED ORDER — SODIUM CHLORIDE 0.9% FLUSH
10.0000 mL | Freq: Two times a day (BID) | INTRAVENOUS | Status: DC
  Administered 2015-12-27: 10 mL

## 2015-12-26 MED ORDER — SODIUM CHLORIDE 0.9% FLUSH: 10.0000 mL | INTRAVENOUS | Status: DC | PRN

## 2015-12-26 MED ORDER — ENOXAPARIN SODIUM 30 MG/0.3ML ~~LOC~~ SOLN
30.0000 mg | Freq: Two times a day (BID) | SUBCUTANEOUS | Status: DC
Start: 2015-12-27 — End: 2016-01-02
  Administered 2015-12-26 – 2016-01-02 (×14): 30 mg via SUBCUTANEOUS
  Filled 2015-12-26 (×14): qty 0.3

## 2015-12-26 MED ORDER — BISACODYL 10 MG RE SUPP
10.0000 mg | Freq: Every day | RECTAL | Status: DC
  Administered 2015-12-26 – 2016-01-01 (×2): 10 mg via RECTAL
  Filled 2015-12-26 (×4): qty 1

## 2015-12-26 MED ORDER — ENOXAPARIN SODIUM 30 MG/0.3ML ~~LOC~~ SOLN
30.0000 mg | SUBCUTANEOUS | Status: AC
  Administered 2015-12-26: 30 mg via SUBCUTANEOUS
  Filled 2015-12-26: qty 0.3

## 2015-12-26 NOTE — Progress Notes (Signed)
Patient ID: Matthew [name], male   DOB: 1990-09-07, 25 y.o.   MRN: [id number] Stable dome neuro progress
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
## 2015-12-26 NOTE — Progress Notes (Signed)
Rehab admissions - i met with patient and his girlfriend at the bedside.  Patient is interested in inpatient rehab stay and then plans to go to his mother's house.  I have left rehab brochures at the bedside.  I would like to see how patient tolerates up in chair with therapy today.  Will follow up tomorrow.  Call me for questions.  #[phone]
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
## 2015-12-27 LAB — BASIC METABOLIC PANEL
Anion gap: 6 (ref 5–15)
BUN: 8 mg/dL (ref 6–20)
CHLORIDE: 104 mmol/L (ref 101–111)
CO2: 27 mmol/L (ref 22–32)
CREATININE: 0.73 mg/dL (ref 0.61–1.24)
Calcium: 8.2 mg/dL — ABNORMAL LOW (ref 8.9–10.3)
GFR calc Af Amer: >60 mL/min (ref >60)
GFR calc non Af Amer: >60 mL/min (ref >60)
Glucose, Bld: 124 mg/dL — ABNORMAL HIGH (ref 65–99)
POTASSIUM: 3.4 mmol/L — AB (ref 3.5–5.1)
SODIUM: 137 mmol/L (ref 135–145)

## 2015-12-27 LAB — CBC
HEMATOCRIT: 33.6 % — AB (ref 39.0–52.0)
HEMOGLOBIN: 10.8 g/dL — AB (ref 13.0–17.0)
MCH: 30 pg (ref 26.0–34.0)
MCHC: 32.1 g/dL (ref 30.0–36.0)
MCV: 93.3 fL (ref 78.0–100.0)
Platelets: 181 10*3/uL (ref 150–400)
RBC: 3.6 MIL/uL — AB (ref 4.22–5.81)
RDW: 13.3 % (ref 11.5–15.5)
WBC: 8.9 10*3/uL (ref 4.0–10.5)

## 2015-12-27 MED ORDER — PREGABALIN 75 MG PO CAPS
75.0000 mg | ORAL_CAPSULE | Freq: Two times a day (BID) | ORAL | Status: DC
  Administered 2015-12-27 – 2016-01-02 (×13): 75 mg via ORAL
  Filled 2015-12-27 (×13): qty 1

## 2015-12-27 MED ORDER — PREGABALIN 75 MG PO CAPS: 75.0000 mg | ORAL_CAPSULE | Freq: Two times a day (BID) | ORAL | Status: DC
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
## 2015-12-27 NOTE — Progress Notes (Signed)
Patient ID: Matthew [name], male   DOB: 05/07/1991, 25 y.o.   MRN: [id number] Neuro stable dressing dry
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
## 2015-12-27 NOTE — Progress Notes (Signed)
Rehab admissions - Noted fever of 101.5 early this am.  Not ready for acute inpatient rehab yet.  Call me for questions.  #[phone]
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
## 2015-12-28 ENCOUNTER — Inpatient Hospital Stay (HOSPITAL_COMMUNITY): Payer: Medicaid Other

## 2015-12-28 LAB — CBC
HCT: 35.3 % — ABNORMAL LOW (ref 39.0–52.0)
Hemoglobin: 11.4 g/dL — ABNORMAL LOW (ref 13.0–17.0)
MCH: 30.8 pg (ref 26.0–34.0)
MCHC: 32.3 g/dL (ref 30.0–36.0)
MCV: 95.4 fL (ref 78.0–100.0)
PLATELETS: 217 10*3/uL (ref 150–400)
RBC: 3.7 MIL/uL — AB (ref 4.22–5.81)
RDW: 13.6 % (ref 11.5–15.5)
WBC: 8.6 10*3/uL (ref 4.0–10.5)

## 2015-12-28 MED ORDER — TRAMADOL HCL 50 MG PO TABS
100.0000 mg | ORAL_TABLET | Freq: Four times a day (QID) | ORAL | Status: DC
  Administered 2015-12-28 – 2016-01-02 (×21): 100 mg via ORAL
  Filled 2015-12-28 (×21): qty 2

## 2015-12-28 MED ORDER — OXYCODONE HCL 5 MG PO TABS
10.0000 mg | ORAL_TABLET | ORAL | Status: DC | PRN
  Administered 2015-12-28: 20 mg via ORAL
  Administered 2015-12-28: 10 mg via ORAL
  Administered 2015-12-29: 20 mg via ORAL
  Administered 2015-12-29: 10 mg via ORAL
  Filled 2015-12-28 (×2): qty 4
  Filled 2015-12-28 (×2): qty 2

## 2015-12-28 MED ORDER — NAPROXEN 250 MG PO TABS
500.0000 mg | ORAL_TABLET | Freq: Two times a day (BID) | ORAL | Status: DC
  Administered 2015-12-28 – 2016-01-02 (×10): 500 mg via ORAL
  Filled 2015-12-28 (×10): qty 2

## 2015-12-28 NOTE — Progress Notes (Signed)
Pt transferred to air mattress but pt states that he does not want to be on it. Pt educated on importance of air mattress. Pain medication was administered. No noted distress. Will continue to monitor.

## 2015-12-28 NOTE — Progress Notes (Signed)
RN will re-establish IV access. 1 mg IV dilaudid witnessed waste in sharps. RN will administer PO medication and continue to monitor patient

## 2015-12-28 NOTE — Progress Notes (Signed)
Rehab admissions - I saw patient and his mom at the bedside.  Patient did not get out of bed just now with PT due to pain.  I spoke with my rehab MD and patient will need to be able to tolerate out of bed up in chair at least 2 hours at a time.  He is not ready for inpatient rehab today.  Call me for questions.  [phone]
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
## 2015-12-28 NOTE — Progress Notes (Signed)
Pt irritated and agitated at this time. Specialty mattress ordered per verbal order from MD per request from therapist. Pt refusing to allow staff at this time to transfer him by lift. Pt encouraged and educated. Pain medication offered prior to transfer but he continued to refuse. Will try again at a later time and administer pain medication prior to transfer for comfort.

## 2015-12-28 NOTE — Progress Notes (Signed)
Patient ID: Matthew [name], male   DOB: 1991/07/03, 25 y.o.   MRN: [id number] satble
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
## 2015-12-29 ENCOUNTER — Encounter: Payer: Self-pay | Admitting: Orthopedic Surgery

## 2015-12-29 MED ORDER — OXYCODONE HCL ER 10 MG PO T12A
20.0000 mg | EXTENDED_RELEASE_TABLET | Freq: Two times a day (BID) | ORAL | Status: DC
  Administered 2015-12-29 – 2016-01-02 (×9): 20 mg via ORAL
  Filled 2015-12-29 (×9): qty 2

## 2015-12-29 NOTE — Progress Notes (Signed)
Patient ID: Matthew [name], male   DOB: 10/28/1990, 25 y.o.   MRN: [id number] Neuro stable. Rehabilitation transfer pending

## 2015-12-29 NOTE — Progress Notes (Signed)
Rehab admissions - Noted PT/OT progress today.  I spoke with rehab team today.  We would like to see patient sitting up i bed and sitting up in chair over the weekend and then will follow up on Monday for potential inpatient rehab admission.  Call me for questions.  [phone]
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
## 2015-12-29 NOTE — Clinical Social Work Note (Signed)
CSW completed SBIRT on patient, patient states he does not drink.  Patient stated he does not have any flashbacks, patient is motivated to start therapy so he can walk again.  Patient expressed he is just taking things day by day.  Patient did not express any other issues or concerns.  Ervin KnackEric R. Adhrit Krenz, MSW, Theresia MajorsLCSWA [phone] 12/29/2015 5:18 PM
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
## 2015-12-29 NOTE — Progress Notes (Signed)
[facility] refused at [facility]; Did not feel like having them changed. Advised pt re: infxn.
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
## 2015-12-30 NOTE — Progress Notes (Signed)
Filed Vitals:   12/29/15 2216 12/30/15 0206 12/30/15 0703 12/30/15 0953  BP: 109/65 120/52 99/52 112/67  Pulse: 64 62 62 58  Temp: 98.1 F (36.7 C) 98.3 F (36.8 C) 98.7 F (37.1 C) 98.5 F (36.9 C)  TempSrc: Oral Oral Oral Oral  Resp: 16 16 16 20   Height:      Weight:      SpO2: 96% 99% 96% 98%    CBC  Recent Labs  12/28/15 0400  WBC 8.6  HGB 11.4*  HCT 35.3*  PLT 217    Patient resting in bed. Posterior cervical dressing clean and dry, have asked nursing staff to change or remove in a.m. Strength 5/5 in upper extremities bilaterally (patient has significant pain when testing right upper extremity due to local injury), including deltoid, biceps, triceps, and grip. Patient remains paraplegic with no movement of lower extremities.  Plan: Continuing PT and OT, awaiting rehabilitation.  Hewitt ShortsNUDELMAN,ROBERT W, MD 12/30/2015, 11:43 AM

## 2015-12-31 NOTE — Progress Notes (Signed)
Filed Vitals:   12/30/15 1803 12/30/15 2158 12/31/15 0142 12/31/15 0628  BP: 114/61 120/69 120/57 99/54  Pulse: 60 62 92 67  Temp: 98.3 F (36.8 C) 98.4 F (36.9 C) 99.1 F (37.3 C) 98.1 F (36.7 C)  TempSrc: Oral Oral Oral Oral  Resp: 20 18 20 18   Height:      Weight:      SpO2: 99% 95% 96% 94%    Patient without change. Good strength in upper extremities, remains paraplegic, with no movement and lower extremities. Dressing removed by nursing staff, and wound left open to air.  Plan: Continuing PT and OT, awaiting rehabilitation.  Hewitt ShortsNUDELMAN,ROBERT W, MD 12/31/2015, 9:04 AM
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
## 2016-01-01 NOTE — Progress Notes (Signed)
Filed Vitals:   12/31/15 0922 12/31/15 2102 01/01/16 0048 01/01/16 0512  BP: 106/55 110/61 105/59 104/51  Pulse: 52 78 63 69  Temp: 99 F (37.2 C) 98.3 F (36.8 C) 98.8 F (37.1 C) 97.8 F (36.6 C)  TempSrc: Oral Oral Oral Oral  Resp: 20 16 18 18   Height:      Weight:      SpO2: 96% 99% 98% 99%    Patient without change from neurosurgical perspective. He remains paraplegic. Continuing PT and OT.  Plan: Rehabilitation.  Hewitt ShortsNUDELMAN,ROBERT W, MD 01/01/2016, 8:58 AM

## 2016-01-01 NOTE — Progress Notes (Signed)
.  Inpatient Rehabilitation  Pt. states he remained in the bed and did not sit up in the chair over the weekend.  PT and OT are scheduled to see pt. today for progression of sitting tolerance as discussed with Mateo FlowBrynn Jones, OT.  Hoping to be able to admit pt. to IP Rehab tomorrow if he can tolerate sitting .  Please call if questions.  Weldon PickingSusan Angelice Piech PT Inpatient Rehab Admissions Coordinator Cell [phone] Office [phone]
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
## 2016-01-02 ENCOUNTER — Inpatient Hospital Stay (HOSPITAL_COMMUNITY): Payer: Medicaid Other

## 2016-01-02 ENCOUNTER — Inpatient Hospital Stay (HOSPITAL_COMMUNITY)
Admission: RE | Admit: 2016-01-02 | Discharge: 2016-01-31 | DRG: 052 | Disposition: A | Discharge status: Home Health | Payer: Medicaid Other | Source: Intra-hospital | Attending: Physical Medicine & Rehabilitation | Admitting: Physical Medicine & Rehabilitation

## 2016-01-02 DIAGNOSIS — S2231XD Fracture of one rib, right side, subsequent encounter for fracture with routine healing: Secondary | ICD-10-CM

## 2016-01-02 DIAGNOSIS — W3400XD Accidental discharge from unspecified firearms or gun, subsequent encounter: Secondary | ICD-10-CM | POA: Diagnosis not present

## 2016-01-02 DIAGNOSIS — S22019D Unspecified fracture of first thoracic vertebra, subsequent encounter for fracture with routine healing: Secondary | ICD-10-CM | POA: Diagnosis not present

## 2016-01-02 DIAGNOSIS — D62 Acute posthemorrhagic anemia: Secondary | ICD-10-CM | POA: Diagnosis present

## 2016-01-02 DIAGNOSIS — S22029A Unspecified fracture of second thoracic vertebra, initial encounter for closed fracture: Secondary | ICD-10-CM | POA: Diagnosis present

## 2016-01-02 DIAGNOSIS — F419 Anxiety disorder, unspecified: Secondary | ICD-10-CM | POA: Diagnosis present

## 2016-01-02 DIAGNOSIS — G8222 Paraplegia, incomplete: Secondary | ICD-10-CM | POA: Diagnosis present

## 2016-01-02 DIAGNOSIS — S22029D Unspecified fracture of second thoracic vertebra, subsequent encounter for fracture with routine healing: Secondary | ICD-10-CM | POA: Diagnosis not present

## 2016-01-02 DIAGNOSIS — K5900 Constipation, unspecified: Secondary | ICD-10-CM | POA: Diagnosis present

## 2016-01-02 DIAGNOSIS — R339 Retention of urine, unspecified: Secondary | ICD-10-CM | POA: Diagnosis present

## 2016-01-02 DIAGNOSIS — S2231XA Fracture of one rib, right side, initial encounter for closed fracture: Secondary | ICD-10-CM | POA: Diagnosis present

## 2016-01-02 DIAGNOSIS — S41131D Puncture wound without foreign body of right upper arm, subsequent encounter: Secondary | ICD-10-CM

## 2016-01-02 DIAGNOSIS — F919 Conduct disorder, unspecified: Secondary | ICD-10-CM | POA: Diagnosis present

## 2016-01-02 DIAGNOSIS — R531 Weakness: Secondary | ICD-10-CM | POA: Diagnosis present

## 2016-01-02 DIAGNOSIS — S22019S Unspecified fracture of first thoracic vertebra, sequela: Secondary | ICD-10-CM

## 2016-01-02 DIAGNOSIS — Z880 Allergy status to penicillin: Secondary | ICD-10-CM

## 2016-01-02 DIAGNOSIS — R609 Edema, unspecified: Secondary | ICD-10-CM

## 2016-01-02 DIAGNOSIS — K592 Neurogenic bowel, not elsewhere classified: Secondary | ICD-10-CM | POA: Diagnosis present

## 2016-01-02 DIAGNOSIS — G8220 Paraplegia, unspecified: Secondary | ICD-10-CM

## 2016-01-02 DIAGNOSIS — N319 Neuromuscular dysfunction of bladder, unspecified: Secondary | ICD-10-CM | POA: Diagnosis present

## 2016-01-02 DIAGNOSIS — S2231XS Fracture of one rib, right side, sequela: Secondary | ICD-10-CM

## 2016-01-02 DIAGNOSIS — S24109S Unspecified injury at unspecified level of thoracic spinal cord, sequela: Secondary | ICD-10-CM

## 2016-01-02 DIAGNOSIS — S24109A Unspecified injury at unspecified level of thoracic spinal cord, initial encounter: Secondary | ICD-10-CM

## 2016-01-02 DIAGNOSIS — E876 Hypokalemia: Secondary | ICD-10-CM | POA: Diagnosis present

## 2016-01-02 DIAGNOSIS — S22019A Unspecified fracture of first thoracic vertebra, initial encounter for closed fracture: Secondary | ICD-10-CM | POA: Diagnosis present

## 2016-01-02 MED ORDER — PROCHLORPERAZINE MALEATE 5 MG PO TABS: 5.0000 mg | ORAL_TABLET | Freq: Four times a day (QID) | ORAL | Status: DC | PRN

## 2016-01-02 MED ORDER — DIPHENHYDRAMINE HCL 12.5 MG/5ML PO ELIX: 12.5000 mg | ORAL_SOLUTION | Freq: Four times a day (QID) | ORAL | Status: DC | PRN

## 2016-01-02 MED ORDER — PHENOL 1.4 % MT LIQD: 1.0000 | OROMUCOSAL | Status: DC | PRN

## 2016-01-02 MED ORDER — ADULT MULTIVITAMIN W/MINERALS CH: 1.0000 | ORAL_TABLET | Freq: Every day | ORAL | Status: DC

## 2016-01-02 MED ORDER — PROCHLORPERAZINE 25 MG RE SUPP: 12.5000 mg | Freq: Four times a day (QID) | RECTAL | Status: DC | PRN

## 2016-01-02 MED ORDER — BACITRACIN ZINC 500 UNIT/GM EX OINT
TOPICAL_OINTMENT | Freq: Two times a day (BID) | CUTANEOUS | Status: DC
  Administered 2016-01-02 – 2016-01-23 (×24): via TOPICAL
  Administered 2016-01-24: 1 via TOPICAL
  Administered 2016-01-25 – 2016-01-26 (×2): via TOPICAL
  Administered 2016-01-27: 1 via TOPICAL
  Administered 2016-01-29: 22:00:00 via TOPICAL
  Filled 2016-01-02 (×11): qty 28.35

## 2016-01-02 MED ORDER — BISACODYL 10 MG RE SUPP
10.0000 mg | Freq: Every day | RECTAL | Status: DC
  Filled 2016-01-02 (×3): qty 1

## 2016-01-02 MED ORDER — CETYLPYRIDINIUM CHLORIDE 0.05 % MT LIQD
7.0000 mL | Freq: Two times a day (BID) | OROMUCOSAL | Status: DC
  Administered 2016-01-02 – 2016-01-29 (×24): 7 mL via OROMUCOSAL

## 2016-01-02 MED ORDER — PROCHLORPERAZINE EDISYLATE 5 MG/ML IJ SOLN: 5.0000 mg | Freq: Four times a day (QID) | INTRAMUSCULAR | Status: DC | PRN

## 2016-01-02 MED ORDER — OXYCODONE HCL 5 MG PO TABS
10.0000 mg | ORAL_TABLET | ORAL | Status: DC | PRN
  Administered 2016-01-03: 15 mg via ORAL
  Administered 2016-01-08: 10 mg via ORAL
  Filled 2016-01-02: qty 2
  Filled 2016-01-02: qty 3

## 2016-01-02 MED ORDER — PREGABALIN 75 MG PO CAPS
75.0000 mg | ORAL_CAPSULE | Freq: Two times a day (BID) | ORAL | Status: DC
  Administered 2016-01-02 – 2016-01-31 (×56): 75 mg via ORAL
  Filled 2016-01-02 (×59): qty 1

## 2016-01-02 MED ORDER — LIDOCAINE HCL 2 % EX GEL: CUTANEOUS | Status: DC | PRN

## 2016-01-02 MED ORDER — GUAIFENESIN-DM 100-10 MG/5ML PO SYRP: 5.0000 mL | ORAL_SOLUTION | Freq: Four times a day (QID) | ORAL | Status: DC | PRN

## 2016-01-02 MED ORDER — ENSURE ENLIVE PO LIQD: 237.0000 mL | Freq: Two times a day (BID) | ORAL | Status: DC

## 2016-01-02 MED ORDER — OXYCODONE HCL ER 10 MG PO T12A
20.0000 mg | EXTENDED_RELEASE_TABLET | Freq: Two times a day (BID) | ORAL | Status: DC
  Administered 2016-01-02 – 2016-01-18 (×32): 20 mg via ORAL
  Filled 2016-01-02 (×33): qty 2

## 2016-01-02 MED ORDER — TRAMADOL HCL 50 MG PO TABS
100.0000 mg | ORAL_TABLET | Freq: Four times a day (QID) | ORAL | Status: DC
  Administered 2016-01-02 – 2016-01-15 (×48): 100 mg via ORAL
  Filled 2016-01-02 (×51): qty 2

## 2016-01-02 MED ORDER — LORAZEPAM 0.5 MG PO TABS: 0.5000 mg | ORAL_TABLET | Freq: Four times a day (QID) | ORAL | Status: DC | PRN

## 2016-01-02 MED ORDER — ALUM & MAG HYDROXIDE-SIMETH 200-200-20 MG/5ML PO SUSP: 30.0000 mL | ORAL | Status: DC | PRN

## 2016-01-02 MED ORDER — ENOXAPARIN SODIUM 30 MG/0.3ML ~~LOC~~ SOLN
30.0000 mg | Freq: Two times a day (BID) | SUBCUTANEOUS | Status: DC
  Administered 2016-01-02 – 2016-01-30 (×49): 30 mg via SUBCUTANEOUS
  Filled 2016-01-02 (×52): qty 0.3

## 2016-01-02 MED ORDER — FLEET ENEMA 7-19 GM/118ML RE ENEM: 1.0000 | ENEMA | Freq: Once | RECTAL | Status: DC | PRN

## 2016-01-02 MED ORDER — METHOCARBAMOL 500 MG PO TABS
500.0000 mg | ORAL_TABLET | Freq: Four times a day (QID) | ORAL | Status: DC | PRN
Start: 2016-01-02 — End: 2016-01-31
  Administered 2016-01-11 – 2016-01-28 (×7): 500 mg via ORAL
  Filled 2016-01-02 (×8): qty 1

## 2016-01-02 MED ORDER — POLYETHYLENE GLYCOL 3350 17 G PO PACK
17.0000 g | PACK | Freq: Every day | ORAL | Status: DC
  Administered 2016-01-03 – 2016-01-04 (×2): 17 g via ORAL
  Filled 2016-01-02 (×2): qty 1

## 2016-01-02 MED ORDER — MENTHOL 3 MG MT LOZG
1.0000 | LOZENGE | OROMUCOSAL | Status: DC | PRN
  Filled 2016-01-02: qty 9

## 2016-01-02 MED ORDER — DOCUSATE SODIUM 100 MG PO CAPS
100.0000 mg | ORAL_CAPSULE | Freq: Two times a day (BID) | ORAL | Status: DC
  Administered 2016-01-02 – 2016-01-04 (×4): 100 mg via ORAL
  Filled 2016-01-02 (×4): qty 1

## 2016-01-02 MED ORDER — TRAZODONE HCL 50 MG PO TABS: 25.0000 mg | ORAL_TABLET | Freq: Every evening | ORAL | Status: DC | PRN

## 2016-01-02 MED ORDER — IPRATROPIUM-ALBUTEROL 0.5-2.5 (3) MG/3ML IN SOLN: 3.0000 mL | RESPIRATORY_TRACT | Status: DC | PRN

## 2016-01-02 MED ORDER — NAPROXEN 250 MG PO TABS
500.0000 mg | ORAL_TABLET | Freq: Two times a day (BID) | ORAL | Status: DC
  Administered 2016-01-02 – 2016-01-31 (×53): 500 mg via ORAL
  Filled 2016-01-02 (×58): qty 2

## 2016-01-02 MED ORDER — LORAZEPAM 2 MG/ML IJ SOLN: 0.5000 mg | Freq: Four times a day (QID) | INTRAMUSCULAR | Status: DC | PRN

## 2016-01-02 MED ORDER — BOOST / RESOURCE BREEZE PO LIQD
1.0000 | Freq: Three times a day (TID) | ORAL | Status: DC
  Administered 2016-01-02 – 2016-01-28 (×26): 1 via ORAL

## 2016-01-02 NOTE — Progress Notes (Signed)
Rehab admissions - I met with patient's mom and girlfriend at the bedside.  Patient was able to participate and tolerate therapies yesterday.  Patient has been cleared for inpatient rehab admission by trauma PA.  Bed available and will admit to inpatient rehab today.  Call me for questions.  #[phone]

## 2016-01-02 NOTE — Interval H&P Note (Signed)
Ginnie SmartJonathan XXXLyons was admitted today to Inpatient Rehabilitation with the diagnosis of T1 SCI.  The patient's history has been reviewed, patient examined, and there is no change in status.  Patient continues to be appropriate for intensive inpatient rehabilitation.  I have reviewed the patient's chart and labs.  Questions were answered to the patient's satisfaction. The PAPE has been reviewed and assessment remains appropriate.  SWARTZ,ZACHARY T 01/02/2016, 5:58 PM

## 2016-01-02 NOTE — Progress Notes (Signed)
Patient ID: Matthew SmartJonathan XXXLyons, male   DOB: Aug 08, 1990, 25 y.o.   MRN: [id number] Neuro stable. To remove staples in am

## 2016-01-02 NOTE — Progress Notes (Signed)
Patient information reviewed and entered into eRehab system by Avett Reineck, RN, CRRN, PPS Coordinator.  Information including medical coding and functional independence measure will be reviewed and updated through discharge.    

## 2016-01-02 NOTE — Clinical Social Work Note (Signed)
Clinical Social Worker continuing to follow patient and family for support and discharge planning needs.  Patient has been accepted to inpatient rehab and is tolerating therapies well today.  Per inpatient rehab admissions coordinator, plan is to admit patient today.  Clinical Social Worker will sign off for now as social work intervention is no longer needed. Please consult us again if new need arises.   Macario GoldsJesse Kenson [place], [place] [phone]
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
## 2016-01-03 ENCOUNTER — Inpatient Hospital Stay (HOSPITAL_COMMUNITY): Payer: Self-pay | Admitting: Occupational Therapy

## 2016-01-03 ENCOUNTER — Inpatient Hospital Stay (HOSPITAL_COMMUNITY): Payer: Medicaid Other | Admitting: Occupational Therapy

## 2016-01-03 ENCOUNTER — Inpatient Hospital Stay (HOSPITAL_COMMUNITY): Payer: Self-pay | Admitting: Physical Therapy

## 2016-01-03 ENCOUNTER — Inpatient Hospital Stay (HOSPITAL_COMMUNITY): Payer: Medicaid Other

## 2016-01-03 DIAGNOSIS — R609 Edema, unspecified: Secondary | ICD-10-CM

## 2016-01-03 DIAGNOSIS — M79609 Pain in unspecified limb: Secondary | ICD-10-CM

## 2016-01-03 LAB — CBC WITH DIFFERENTIAL/PLATELET
BASOS ABS: 0 10*3/uL (ref 0.0–0.1)
BASOS PCT: 0 %
Eosinophils Absolute: 0.3 10*3/uL (ref 0.0–0.7)
Eosinophils Relative: 3 %
HEMATOCRIT: 41.5 % (ref 39.0–52.0)
HEMOGLOBIN: 13.4 g/dL (ref 13.0–17.0)
Lymphocytes Relative: 21 %
Lymphs Abs: 1.9 10*3/uL (ref 0.7–4.0)
MCH: 30.5 pg (ref 26.0–34.0)
MCHC: 32.3 g/dL (ref 30.0–36.0)
MCV: 94.3 fL (ref 78.0–100.0)
MONOS PCT: 8 %
Monocytes Absolute: 0.8 10*3/uL (ref 0.1–1.0)
NEUTROS ABS: 6.1 10*3/uL (ref 1.7–7.7)
Neutrophils Relative %: 68 %
Platelets: 415 10*3/uL — ABNORMAL HIGH (ref 150–400)
RBC: 4.4 MIL/uL (ref 4.22–5.81)
RDW: 13.6 % (ref 11.5–15.5)
WBC: 9.2 10*3/uL (ref 4.0–10.5)

## 2016-01-03 LAB — COMPREHENSIVE METABOLIC PANEL
ALBUMIN: 3.2 g/dL — AB (ref 3.5–5.0)
ALK PHOS: 60 U/L (ref 38–126)
ALT: 48 U/L (ref 17–63)
ANION GAP: 7 (ref 5–15)
AST: 35 U/L (ref 15–41)
BILIRUBIN TOTAL: 0.7 mg/dL (ref 0.3–1.2)
BUN: 30 mg/dL — ABNORMAL HIGH (ref 6–20)
CHLORIDE: 99 mmol/L — AB (ref 101–111)
CO2: 29 mmol/L (ref 22–32)
Calcium: 9.5 mg/dL (ref 8.9–10.3)
Creatinine, Ser: 0.93 mg/dL (ref 0.61–1.24)
GFR calc Af Amer: >60 mL/min (ref >60)
GFR calc non Af Amer: >60 mL/min (ref >60)
Glucose, Bld: 92 mg/dL (ref 65–99)
POTASSIUM: 4.5 mmol/L (ref 3.5–5.1)
Sodium: 135 mmol/L (ref 135–145)
Total Protein: 7.5 g/dL (ref 6.5–8.1)
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
## 2016-01-03 NOTE — Progress Notes (Signed)
VASCULAR LAB PRELIMINARY  PRELIMINARY  PRELIMINARY  PRELIMINARY  Bilateral lower extremity venous duplex completed.    Bilateral:  No evidence of DVT, superficial thrombosis, or Baker's Cyst.  Bilateral: lymph nodes in groin areas.  Gave RN, Chelsea the results.   Jenetta Logesami Ameerah Huffstetler, RVT, RDMS 01/03/2016, 3:19 PM

## 2016-01-03 NOTE — Plan of Care (Signed)
Problem: SCI BOWEL ELIMINATION Goal: RH STG SCI MANAGE BOWEL WITH MEDICATION WITH ASSISTANCE STG SCI Manage bowel with medication with assistance.  Outcome: Not Progressing Pt refusing bowel program although consistently educated     Problem: SCI BLADDER ELIMINATION Goal: RH STG MANAGE BLADDER WITH ASSISTANCE STG Manage Bladder With Assistance  Outcome: Not Progressing Incontinent- condom cath
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
## 2016-01-03 NOTE — Progress Notes (Signed)
Occupational Therapy Session Note  Patient Details  Name: Matthew [name] MRN: [id number] Date of Birth: [birth year]  Today's Date: 01/03/2016 OT Individual Time: [id number] OT Individual Time Calculation (min): 25 min  and Today's Date: 01/03/2016 OT Missed Time: 35 Minutes Missed Time Reason: Patient fatigue   Short Term Goals: Week 1:  OT Short Term Goal 1 (Week 1): Pt will complete BSC transfer with max A +1 with +2 for safety if needed OT Short Term Goal 2 (Week 1): Pt will dress UB with set-up assist.  OT Short Term Goal 3 (Week 1): Pt will tolerate 3 consecutive hours up in chair in order to increase functional activity tolerance OT Short Term Goal 4 (Week 1): Pt will don pants with max A  Skilled Therapeutic Interventions/Progress Updates:  Upon entering the room, pt supine in bed with no c/o pain this session. Pt with c/o fatigue and reports being sick in last therapy session. Pt wishes to remain in bed. Pt did allow therapist to remove ACE wrap and TEDs from B LEs as pt is remaining in bed. OT continued education regarding regulation of BP and symptoms associated with hypotension with movement. Pt verbalized understanding but declined further OT intervention. Pt repositioned in bed with second helper and pt remained in bed with call bell and all needed items within reach upon exiting the room.   Therapy Documentation Precautions:  Precautions Precautions: Fall Precaution Comments: Monitor BP, ACEs, TEDs Restrictions Weight Bearing Restrictions: No RUE Weight Bearing: Non weight bearing General: General Chart Reviewed: Yes OT Amount of Missed Time: 35 Minutes Vital Signs: Therapy Vitals Temp: 97.8 F (36.6 C) Temp Source: Oral Pulse Rate: 62 Resp: 18 BP: 104/62 mmHg Patient Position (if appropriate): Lying Oxygen Therapy SpO2: 98 % O2 Device: Not Delivered Pain: Pain Assessment Pain Assessment: 0-10 Pain Score: 5  Pain Type: Acute pain Pain Location:  Arm Pain Orientation: Right Pain Descriptors / Indicators: Aching Pain Frequency: Constant Pain Onset: On-going Patients Stated Pain Goal: 2 Pain Intervention(s): Emotional support  See Function Navigator for Current Functional Status.   Therapy/Group: Individual Therapy  [name], [name] L 01/03/2016, 3:40 PM
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
## 2016-01-03 NOTE — Progress Notes (Signed)
Patient refuses to be cath to get urine; he also refuses lab to get draw his blood and he refuses his suppository. He was educated but he continue to refuse.

## 2016-01-03 NOTE — Care Management Note (Signed)
[facility] Individual Statement of Services  Patient Name:  Matthew [name]  Date:  01/03/2016  Welcome to the [facility].  Our goal is to provide you with an individualized program based on your diagnosis and situation, designed to meet your specific needs.  With this comprehensive rehabilitation program, you will be expected to participate in at least 3 hours of rehabilitation therapies Monday-Friday, with modified therapy programming on the weekends.  Your rehabilitation program will include the following services:  Physical Therapy (PT), Occupational Therapy (OT), Speech Therapy (ST), 24 hour per day rehabilitation nursing, Therapeutic Recreaction (TR), Neuropsychology, Case Management (Social Worker), Rehabilitation Medicine, Nutrition Services and Pharmacy Services  Weekly team conferences will be held on Tuesdays to discuss your progress.  Your Social Worker will talk with you frequently to get your input and to update you on team discussions.  Team conferences with you and your family in attendance may also be held.  Expected length of stay: 3-4 weeks  Overall anticipated outcome: minimal assist @ wheelchair  Depending on your progress and recovery, your program may change. Your Social Worker will coordinate services and will keep you informed of any changes. Your Social Worker's name and contact numbers are listed  below.  The following services may also be recommended but are not provided by the [facility]:   Driving Evaluations  Home Health Rehabiltiation Services  Outpatient Rehabilitation Services  Vocational Rehabilitation   Arrangements will be made to provide these services after discharge if needed.  Arrangements include referral to agencies that provide these services.  Your insurance has been verified to be:  *Medicaid application pending Your primary doctor is:  None *Child psychotherapistocial Worker will assist with  securing a primary care physician  Pertinent information will be shared with your doctor and your insurance company.  Social Worker:  Holmes BeachLucy Jenesys [name], [place] [phone] or ([phone]) [phone]   Information discussed with and copy given to patient by: Matthew [name], Matthew [name], 01/03/2016, 3:26 PM
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
## 2016-01-04 ENCOUNTER — Inpatient Hospital Stay (HOSPITAL_COMMUNITY): Payer: Medicaid Other | Admitting: Occupational Therapy

## 2016-01-04 ENCOUNTER — Inpatient Hospital Stay (HOSPITAL_COMMUNITY): Payer: Medicaid Other

## 2016-01-04 ENCOUNTER — Inpatient Hospital Stay (HOSPITAL_COMMUNITY): Payer: Self-pay | Admitting: Occupational Therapy

## 2016-01-04 LAB — URINE CULTURE

## 2016-01-04 MED ORDER — POLYETHYLENE GLYCOL 3350 17 G PO PACK
17.0000 g | PACK | Freq: Every day | ORAL | Status: DC
  Administered 2016-01-05 – 2016-01-08 (×4): 17 g via ORAL
  Filled 2016-01-04 (×4): qty 1

## 2016-01-04 NOTE — Progress Notes (Signed)
Occupational Therapy Session Note  Patient Details  Name: Matthew SmartJonathan XXXLyons MRN: [id number] Date of Birth: [birth year]  Today's Date: 01/04/2016 OT Individual Time: 0900-1015 OT Individual Time Calculation (min): 75 min    Short Term Goals: Week 1:  OT Short Term Goal 1 (Week 1): Pt will complete BSC transfer with max A +1 with +2 for safety if needed OT Short Term Goal 2 (Week 1): Pt will dress UB with set-up assist.  OT Short Term Goal 3 (Week 1): Pt will tolerate 3 consecutive hours up in chair in order to increase functional activity tolerance OT Short Term Goal 4 (Week 1): Pt will don pants with max A  Skilled Therapeutic Interventions/Progress Updates:    Pt seen for OT session focusing on functional sitting balance, functional transfers, and education. Pt sitting up in w/c upon arrival, set-up with breakfast tray having just finished with PT. Pt agreeable to tx session. While pt ate breakfast educated regarding CIR, OT/PT goals, and SCI and anatomy. Pt unengaged during education, playing on cell phone while therapist speaking. When addressed, pt stating he was able to multi-task and was listening to therapist. He did ask questions when appropriate.  In therapy gym, pt completed sliding board transfer with +2 assist to therapy mat with VCs provided throughout for proper technique and hand placement during transfer. Seated on therapy mat, worked on pt establishing static/ dynamic sitting balance and midline sitting. Pt initially required consistent min A, however, progressed to close supervision, however, with compensatory strategies, tilting head towards left while sitting posteriorly with R weight shift. Mirror placed in front of pt for visual feedback and able to use core slightly to self adjust posture.  Completed reaching task seated EOM, reaching for cups and horseshoes, requiring one UE support and min A for stability. Pt able to maintain dynamic balance <10 seconds with supervision.   He completed sliding board transfer from EOM > drop arm BSC. Required +2 physical assist to get to commode, completed return to mat with max A +1 with +2 to steady equipment.  He returned to room at end of session, left with all needs in reach.  Pt provided with timer for boosting schedule. Educated regarding purpose and technique for boosting- pt voiced understanding.   Therapy Documentation Precautions:  Precautions Precautions: Fall Precaution Comments: Monitor BP, ACEs, TEDs Restrictions Weight Bearing Restrictions: No RUE Weight Bearing: Non weight bearing Pain:   No/ denies pain  See Function Navigator for Current Functional Status.   Therapy/Group: Individual Therapy  [name], Allien Melberg C 01/04/2016, 7:12 AM
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
## 2016-01-04 NOTE — Progress Notes (Signed)
Physical Therapy Session Note  Patient Details  Name: Matthew SmartJonathan XXXLyons MRN: [id number] Date of Birth: 07/27/1991  Today's Date: 01/04/2016 PT Individual Time: 0800-0900 PT Individual Time Calculation (min): 60 min   Short Term Goals: Week 1:  PT Short Term Goal 1 (Week 1): Pt demonstrate supine<>sit with mod assist PT Short Term Goal 2 (Week 1): Pt will demonstrate dynamic sitting balance with mod assist PT Short Term Goal 3 (Week 1): Pt will demonstrate slide board transfer with +1 assist  Skilled Therapeutic Interventions/Progress Updates:    Session focused on education, bed mobility, sitting balance, transfers, functional use of UE's to perform oral hygiene at sink, and upright tolerance. Educated on use of trial with abdominal binder today to increase OOB tolerance (RN notified as no abdominal binder in the room). PT donned Tedhose and BLE ACE wraps for BP control. Pt able to roll with extra time and verbal cues for technique with mod assist to don pants. Cues to for technique to get to EOB and during slideboard transfer with total assist and +2 for transfer and positioning in the w/c. Set up at sink and pt able to perform functional reaching tasks for oral hygiene with supervision. Set-up with breakfast tray and all needs in reach. BP taken during session and WNL.   Therapy Documentation Precautions:  Precautions Precautions: Fall Precaution Comments: Monitor BP, ACEs, TEDs Restrictions Weight Bearing Restrictions: No RUE Weight Bearing: Non weight bearing   Vital Signs: BP: 117/73 mmHg Patient Position (if appropriate): Sitting Pain: C/o pain in RUE - premedicated per report       See Function Navigator for Current Functional Status.   Therapy/Group: Individual Therapy  Karolee StampsGray, Yasmeen Manka Darrol PokeBrescia  Jowell Bossi B. Dia Jefferys, PT, DPT  01/04/2016, 9:29 AM

## 2016-01-04 NOTE — Plan of Care (Signed)
Problem: SCI BOWEL ELIMINATION Goal: RH STG MANAGE BOWEL WITH ASSISTANCE STG Manage Bowel with min Assistance.  Outcome: Not Progressing LBM 5/29 but refusing bowel program   Problem: SCI BLADDER ELIMINATION Goal: RH STG MANAGE BLADDER WITH ASSISTANCE STG Manage Bladder With min Assistance  Outcome: Not Progressing Condom cath at night
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
## 2016-01-04 NOTE — Progress Notes (Signed)
Physical Therapy Session Note  Patient Details  Name: Matthew [name] MRN: [id number] Date of Birth: [birth year]  Today's Date: 01/04/2016 PT Individual Time: 1500-1530 PT Individual Time Calculation (min): 30 min   Short Term Goals: Week 1:  PT Short Term Goal 1 (Week 1): Pt demonstrate supine<>sit with mod assist PT Short Term Goal 2 (Week 1): Pt will demonstrate dynamic sitting balance with mod assist PT Short Term Goal 3 (Week 1): Pt will demonstrate slide board transfer with +1 assist  Skilled Therapeutic Interventions/Progress Updates:    Session focused on completing and instructing patient in BLE HEP for ROM and stretching in all planes at ankle, knee and hip with education on purpose and importance. Handout given and reviewed with patient recommendations for repetitions for each and making this a part of daily routine. Pt verbalized understanding but appears lethargic during session. Removed ACE wraps and Tedhose as pt declines OOB anymore this afternoon.  Therapy Documentation Precautions:  Precautions Precautions: Fall Precaution Comments: Monitor BP, ACEs, TEDs   Pain:  No complaints. Lethargic.     See Function Navigator for Current Functional Status.   Therapy/Group: Individual Therapy  Karolee StampsGray, Adriana Lina Darrol PokeBrescia  Ossiel Marchio B. Devra Stare, PT, DPT  01/04/2016, 3:45 PM

## 2016-01-04 NOTE — Progress Notes (Signed)
Pt said he is refusing his 0600 Am bowel program because "nobody here is going to touch me like that." Pt said the only people he will allow to insert suppository is his mom or girlfriend. Will pass on to dayshift. Rudie MeyerEurillo, Yareli Carthen A, RN

## 2016-01-04 NOTE — Progress Notes (Signed)
Orthopedic Tech Progress Note Patient Details:  Matthew [name] Oct 26, 1990 [id number]  Ortho Devices Type of Ortho Device: Abdominal binder Ortho Device/Splint Location: abdomen Ortho Device/Splint Interventions: Ordered   [name], [name] 01/04/2016, 9:30 AM

## 2016-01-04 NOTE — Progress Notes (Signed)
Physical Therapy Session Note  Patient Details  Name: Matthew [name] MRN: [id number] Date of Birth: 06-25-1991  Today's Date: 01/04/2016 PT Individual Time: 1115-1200 PT Individual Time Calculation (min): 45 min   Short Term Goals: Week 1:  PT Short Term Goal 1 (Week 1): Pt demonstrate supine<>sit with mod assist PT Short Term Goal 2 (Week 1): Pt will demonstrate dynamic sitting balance with mod assist PT Short Term Goal 3 (Week 1): Pt will demonstrate slide board transfer with +1 assist  Skilled Therapeutic Interventions/Progress Updates:   Pt up in tilt in space w/c denying any issues with BP or wooziness this morning. Reports he has been calling for pressure relief per recommendations. Pt request to return back to bed to rest due to fatigue. Performed max to total A slideboard transfer with +2 for safety and equipment management and cues for technique and anterior weightshift. Total +2 to return to supine position and increasing pain in RUE. Educated pt on use of leg loops and demonstrated with a sample. Pt measured for personal leg loops and excited about ability to try to move his LE's without assistance. Handout given and started to review for LE stretching/HEP and will finish and demonstrate in PM session. All needs in reach.   Therapy Documentation Precautions:  Precautions Precautions: Fall Precaution Comments: Monitor BP, ACEs, TEDs  Pain: C/o RUE pain with mobility - rest breaks as needed.    See Function Navigator for Current Functional Status.   Therapy/Group: Individual Therapy  Karolee StampsGray, Jamelah Sitzer Darrol PokeBrescia  Jasmia Angst B. Eryc Bodey, PT, DPT  01/04/2016, 12:30 PM

## 2016-01-05 ENCOUNTER — Inpatient Hospital Stay (HOSPITAL_COMMUNITY): Payer: Self-pay | Admitting: Occupational Therapy

## 2016-01-05 ENCOUNTER — Inpatient Hospital Stay (HOSPITAL_COMMUNITY): Payer: Medicaid Other

## 2016-01-05 DIAGNOSIS — K592 Neurogenic bowel, not elsewhere classified: Secondary | ICD-10-CM

## 2016-01-05 DIAGNOSIS — N319 Neuromuscular dysfunction of bladder, unspecified: Secondary | ICD-10-CM

## 2016-01-05 LAB — URINALYSIS, ROUTINE W REFLEX MICROSCOPIC
Bilirubin Urine: NEGATIVE
Glucose, UA: NEGATIVE mg/dL
Hgb urine dipstick: NEGATIVE
KETONES UR: NEGATIVE mg/dL
NITRITE: NEGATIVE
PROTEIN: NEGATIVE mg/dL
Specific Gravity, Urine: 1.034 — ABNORMAL HIGH (ref 1.005–1.030)
pH: 5.5 (ref 5.0–8.0)

## 2016-01-05 LAB — URINE MICROSCOPIC-ADD ON

## 2016-01-05 MED ORDER — SORBITOL 70 % SOLN
60.0000 mL | Status: AC
  Administered 2016-01-05: 60 mL via ORAL
  Filled 2016-01-05: qty 60

## 2016-01-05 NOTE — Progress Notes (Signed)
Patient bladder scan for 587 ml, abdomen noted to be distended. No complaints of pain/discomfort verbalized by patient. Education provided to patient of the need to be in and out cath by Marissa NestlePam Love, PA and nurse. Patient continued to refuse. At 1630 pm patient had a incontinent episode of bladder and post void residual 119 ml.

## 2016-01-05 NOTE — Progress Notes (Signed)
Patient is refusing bowel program that scheduled at 0600 am. Discuss with patient the importance of having a regular bowel movement, and the purpose of bowel program. Patient stated "tired of us telling him the same thing, and my mom or girlfriend can do the suppository. I've told everyone that but no one has called my mom".  Also scanned bladder after void for 437 ml. Explained to patient that we need to in and out cath him. He states " I need to be knocked out before you do that".  Notified Marissa NestlePam Love, PA that patient is refusing to allow us to in and out cath, and made aware of patient's comments. Also notified mother TurkeyVictoria that patient is refusing to allow us to help with bowel program, and he stated he will allow her to give suppository.Told TurkeyVictoria we will educate her on how to perform bowel program. She verbalized understanding. She stated she may be able to come today after 1300 pm, or tomorrow between 7 am-9 am. Discuss with charge nurse that mother maybe here for bowel program education in morning. [place]

## 2016-01-05 NOTE — Progress Notes (Signed)
Physical Therapy Session Note  Patient Details  Name: Matthew SmartJonathan [name] MRN: [id number] Date of Birth: Mar 15, 1991  Today's Date: 01/05/2016 PT Individual Time: 1105-1200 PT Individual Time Calculation (min): 55 min   Short Term Goals: Week 1:  PT Short Term Goal 1 (Week 1): Pt demonstrate supine<>sit with mod assist PT Short Term Goal 2 (Week 1): Pt will demonstrate dynamic sitting balance with mod assist PT Short Term Goal 3 (Week 1): Pt will demonstrate slide board transfer with +1 assist  Skilled Therapeutic Interventions/Progress Updates:   Session focused on functional slideboard transfer training, overall endurance/activity tolerance, neuro re-ed for sitting balance and trunk control training, bed mobility, and changing out w/c for better fit and postural support. Pt requires max assist for transfers with +2 for equipment and safety with cues for technique and hand placement. Pt able to maintain dynamic sitting balance with min to mod assist during functional reaching tasks outside BOS. Cues for upright posture and hand placement. Changed w/c to another TIS that is smaller width to allow for more support with specialty back. This w/c does not have headrest component so continue to monitor pt tolerance. Pt requesting to return back to bed end of session, so was not in it long enough to determine. All needs in reach positioned in supine.   Therapy Documentation Precautions:  Precautions Precautions: Fall Precaution Comments: Monitor BP, ACEs, TEDs    Pain:  pain in RUE and R side - premedicated.    See Function Navigator for Current Functional Status.   Therapy/Group: Individual Therapy  Karolee StampsGray, Matthew [name]  Valta Dillon B. Ismar Yabut, PT, DPT  01/05/2016, 12:30 PM
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
## 2016-01-05 NOTE — Progress Notes (Signed)
Occupational Therapy Session Note  Patient Details  Name: Matthew Matthew [name] MRN: [id number] Date of Birth: April 14, 1991  Today's Date: 01/05/2016 OT Individual Time: 0930-1045 and 1400-1500 and 1515-1540 OT Individual Time Calculation (min): 75 min and 60 min and 25 min   Short Term Goals: Week 1:  OT Short Term Goal 1 (Week 1): Pt will complete BSC transfer with max A +1 with +2 for safety if needed OT Short Term Goal 2 (Week 1): Pt will dress UB with set-up assist.  OT Short Term Goal 3 (Week 1): Pt will tolerate 3 consecutive hours up in chair in order to increase functional activity tolerance OT Short Term Goal 4 (Week 1): Pt will don pants with max A  Skilled Therapeutic Interventions/Progress Updates:    Session One: Pt seen for OT session focusing on ADL re-training and functional sitting balance. Pt in supine upon arrival, agreeable to tx session. UB bathing completed with set-up in supine. He declined LB bathing stating that nursing assisted earlier this A.M. Attempted modified circle sitting for LB dressing, however, due to tone and decreased flexibility, unable to get LE close enough for pt to reach to assist with threading. He rolled with mod A to have pants pulled up. TED hose and ACE wraps donned total A. He transferred to EOB with max A, pt able to assist with bringing trunk upright. Sliding board transfer completed to w/Matthew [name] with max A +1 with +2 available for safety. In therapy gym, worked on pt bringing self forward off back of w/Matthew [name], using B UEs. Working on maintaining static and dynamic unsupported sitting balance. Able to maintain static balance with B UEs in lap, requiring min A for dynamic balance. Pt returned to room at end of session, left with all needs in reach.   Session Two (1400-1500 and 1515-1540) : Pt seen for OT session focusing on functional sitting balance, functional transfers, and ADL re-training. Pt sitting up in w/Matthew [name] upon arrival, agreeable to tx session. In  therapy gym, pt competed sliding board transfer with max A with +2 to stabilize equipment. Worked on sitting balance EOM with pt tossing beach ball. Required mod- total A for dynamic balance without UE support.  Pt with complaints of stomach ache during session and requesting to attempt toileting on BSC. Completed sliding board transfer with +2 assist. Total A +2 required for clothing management on BSC, using lateral leans to assist.  He had continent BM on BSC. Pt requested therapist to return to assist with hygiene when he was finished.  OT called back to room at 15:15. Maximove used to transfer pt to bed and hygiene completed with total A. Pt rolled in bed with mod-max A for hygiene and new brief to be donned. Attempted education regarding bowel/ bladder program. Pt refusing education saying " I don't need that stuff". Explained what bowel/ bladder program could look like at home and options for d/Matthew [name], however, pt cont to refuse. Pt's mother and grandmother present for session, and receptive to education provided by therapist. However, unsure of their ability/ willingness to perform needed care at d/Matthew [name]. Will cont to follow and educate  Communicated this with RN,and PA   Therapy Documentation Precautions:  Precautions Precautions: Fall Precaution Comments: Monitor BP, ACEs, TEDs Restrictions Weight Bearing Restrictions: No RUE Weight Bearing: Non weight bearing Pain:    See Function Navigator for Current Functional Status.   Therapy/Group: Individual Therapy  Matthew Matthew [name], Matthew Matthew [name] 01/05/2016, 7:04 AM

## 2016-01-05 NOTE — Progress Notes (Signed)
Physical Therapy Session Note  Patient Details  Name: Matthew [name] MRN: [id number] Date of Birth: 1991/04/10  Today's Date: 01/05/2016 PT Individual Time: 1300-1345 PT Individual Time Calculation (min): 45 min   Short Term Goals: Week 1:  PT Short Term Goal 1 (Week 1): Pt demonstrate supine<>sit with mod assist PT Short Term Goal 2 (Week 1): Pt will demonstrate dynamic sitting balance with mod assist PT Short Term Goal 3 (Week 1): Pt will demonstrate slide board transfer with +1 assist  Skilled Therapeutic Interventions/Progress Updates:   Session focused on bed mobility re-training, functional slideboard transfer OOB with emphasis on technique, and w/c adjustment and positioning. Education on pressure relief (pt able to recall every 30 min needed) and educated on progression with goal for pt to perform w/c push-up for pressure relief. Also educated on progression of w/c to work towards trial in manual standard w/c to attempt propulsion now that pt is starting to tolerate upright sitting more without BP issues. Pt able to complete transfer with max assist and verbal/tactile cues for hand placement, head/hips relationship, and anterior weightshift. Pt left up in w/c with gait belt donned to thighs to prevent abduction and lunch tray next to patient. Call bell in reach.   Therapy Documentation Precautions:  Precautions Precautions: Fall Precaution Comments: Monitor BP, ACEs, TEDs Restrictions Weight Bearing Restrictions: No RUE Weight Bearing: Weight bearing as tolerated    Pain:  Premedicated for pain in R UE and side - pt lethargic during session.    See Function Navigator for Current Functional Status.   Therapy/Group: Individual Therapy  Matthew [name], Matthew [name]  Matthew [name], PT, DPT  01/05/2016, 2:17 PM
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
## 2016-01-06 ENCOUNTER — Inpatient Hospital Stay (HOSPITAL_COMMUNITY): Payer: Medicaid Other

## 2016-01-06 DIAGNOSIS — K592 Neurogenic bowel, not elsewhere classified: Secondary | ICD-10-CM | POA: Insufficient documentation

## 2016-01-06 DIAGNOSIS — K5900 Constipation, unspecified: Secondary | ICD-10-CM

## 2016-01-06 NOTE — Progress Notes (Signed)
RN and NT at bedside at 1800 to do bladder scan. Pt scanned for 450 and pt reports that he feels the need to urinate. Pt assisted to use the urinal. Pt voided 420 cc of urine. Post void residual is 20. Pt does not appear to be bloated or distended. Pt repositioned in bed and left with urinal in reach. Continue plan of care.
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
## 2016-01-06 NOTE — Progress Notes (Signed)
Pt reports that he voided with Occupational therapy this morning. Refused to allow RN to scan him right now. Agreeable to scan later this afternoon. Will continue to monitor.

## 2016-01-06 NOTE — Plan of Care (Signed)
Problem: SCI BOWEL ELIMINATION Goal: RH STG MANAGE BOWEL WITH ASSISTANCE STG Manage Bowel with total Assistance.  Outcome: Not Progressing Refusing bowel program  Problem: SCI BLADDER ELIMINATION Goal: RH STG MANAGE BLADDER WITH ASSISTANCE STG Manage Bladder With total Assistance  Outcome: Not Progressing Urinary retention. Pt refusing I&O caths

## 2016-01-06 NOTE — Progress Notes (Signed)
Patient called assistance to use urinal at 2051. Patient voided 300 cc. Bladder scanned for 132 cc.

## 2016-01-06 NOTE — Progress Notes (Signed)
Occupational Therapy Session Note  Patient Details  Name: Matthew [name] MRN: [id number] Date of Birth: [birth year]  Today's Date: 01/06/2016 OT Individual Time: 0900-0930 OT Individual Time Calculation (min): 30 min    Short Term Goals: Week 1:  OT Short Term Goal 1 (Week 1): Pt will complete BSC transfer with max A +1 with +2 for safety if needed OT Short Term Goal 2 (Week 1): Pt will dress UB with set-up assist.  OT Short Term Goal 3 (Week 1): Pt will tolerate 3 consecutive hours up in chair in order to increase functional activity tolerance OT Short Term Goal 4 (Week 1): Pt will don pants with max A  Skilled Therapeutic Interventions/Progress Updates:    OT session focused on bed mobility, independence with toileting, and transfers. Pt received supine in bed agreeable to transfer to w/c. Pt requesting to void therefore provided urinal. Pt required min A to positioning urinal before voiding. Donned shorts with total A and max A for rolling L<>R. Pt required increased time for bed mobility due to pain in RUE/shoulder. Pt declined SBT transfer therefore utilized maxi move with +2 assist for safety. Pt left reclined in w/c with QRB and all needs in reach.   Therapy Documentation Precautions:  Precautions Precautions: Fall Precaution Comments: Monitor BP, ACEs, TEDs Restrictions Weight Bearing Restrictions: No RUE Weight Bearing: Non weight bearing General:   Vital Signs: Therapy Vitals Temp: 97.7 F (36.5 C) Temp Source: Oral Pulse Rate: (!) 57 Resp: 16 BP: 97/62 mmHg Patient Position (if appropriate): Lying Oxygen Therapy SpO2: 100 % O2 Device: Not Delivered Pain: Pain in R shoulder with mobility and transfers  Other Treatments:    See Function Navigator for Current Functional Status.   Therapy/Group: Individual Therapy  Daneil Danerkinson, Matthew [name] 01/06/2016, 9:57 AM

## 2016-01-06 NOTE — Progress Notes (Signed)
Pt with no void since 10pm last night. Pt educated on the need for in and out cath for no void. Pt refusing to allow RN to scan bladder or perform I and O cath at this time. Will continue to educate pt.

## 2016-01-07 ENCOUNTER — Inpatient Hospital Stay (HOSPITAL_COMMUNITY): Payer: Self-pay | Admitting: Occupational Therapy

## 2016-01-07 ENCOUNTER — Inpatient Hospital Stay (HOSPITAL_COMMUNITY): Payer: Self-pay | Admitting: Physical Therapy

## 2016-01-07 DIAGNOSIS — N319 Neuromuscular dysfunction of bladder, unspecified: Secondary | ICD-10-CM | POA: Insufficient documentation

## 2016-01-07 DIAGNOSIS — K592 Neurogenic bowel, not elsewhere classified: Secondary | ICD-10-CM | POA: Insufficient documentation

## 2016-01-07 NOTE — Plan of Care (Signed)
Problem: SCI BOWEL ELIMINATION Goal: RH STG MANAGE BOWEL WITH ASSISTANCE STG Manage Bowel with total Assistance.  Outcome: Not Progressing Refusing bowel program

## 2016-01-07 NOTE — Progress Notes (Signed)
Occupational Therapy Session Note  Patient Details  Name: Matthew [name] MRN: [id number] Date of Birth: 1990-12-26  Today's Date: 01/07/2016 OT Individual Time: 1300-1345 OT Individual Time Calculation (min): 45 min    Short Term Goals: Week 1:  OT Short Term Goal 1 (Week 1): Pt will complete BSC transfer with max A +1 with +2 for safety if needed OT Short Term Goal 2 (Week 1): Pt will dress UB with set-up assist.  OT Short Term Goal 3 (Week 1): Pt will tolerate 3 consecutive hours up in chair in order to increase functional activity tolerance OT Short Term Goal 4 (Week 1): Pt will don pants with max A  Skilled Therapeutic Interventions/Progress Updates:    Pt seen for OT session focusing on functional transfers, ADL re-training, and functional sitting balance. Pt asleep in w/c upon arrival, agreeable to tx session. Pt reports that he has been calling nursing to boost him throughout the day. In therapy gym, pt completed mod-max A transfer to therapy mat. He sat on EOM to complete Wii bowling game focusing on dynamic sitting balance. Min steadying assist provided from behind, working on pt letting go with UE to test dynamic balance. Pt completed sliding board transfer back to w/c at min-mod A with pt able to assist much more this transfer compared to previous transfers, displaying improved balance and technique for sliding board transfer. In room, pt completed UB dressing with min A and increased time. Pt with limited R UE ROM due to wound in R arm. VCs provided for hemi- dressing technique. Pt desired to stay in w/c until next session. All needs in reach and boosting alarm set.  Therapy Documentation Precautions:  Precautions Precautions: Fall Precaution Comments: Monitor BP, ACEs, TEDs Restrictions Weight Bearing Restrictions: No RUE Weight Bearing: Non weight bearing Pain: Pain Assessment Pain Assessment: 0-10 Pain Score: 2  Pain Type: Acute pain Pain Location: Arm Pain  Orientation: Right Patients Stated Pain Goal: 2 Pain Intervention(s): Repositioned  See Function Navigator for Current Functional Status.   Therapy/Group: Individual Therapy  [name], Lakeitha Basques C 01/07/2016, 2:48 PM

## 2016-01-07 NOTE — Progress Notes (Signed)
Pt requested to attempt voiding in the urinal. Pt was noted to have a slightly wet brief. Pt had difficulty starting stream but was able to void 550 ml of urine. Post void residual is 41.

## 2016-01-07 NOTE — Plan of Care (Signed)
Problem: SCI BOWEL ELIMINATION Goal: RH STG SCI MANAGE BOWEL PROGRAM W/ASSIST OR AS APPROPRIATE STG SCI Manage bowel program w/min assist or as appropriate.  Outcome: Not Progressing Refusing bowel program

## 2016-01-07 NOTE — Plan of Care (Signed)
Problem: SCI BLADDER ELIMINATION Goal: RH STG MANAGE BLADDER WITH ASSISTANCE STG Manage Bladder With total Assistance  Outcome: Not Progressing Urinary retention. Pt refusing prn I&O caths

## 2016-01-07 NOTE — Progress Notes (Signed)
Physical Therapy Session Note  Patient Details  Name: Matthew [name] MRN: [id number] Date of Birth: 1991/07/25  Today's Date: 01/07/2016 PT Individual Time: 1430-1515 PT Individual Time Calculation (min): 45 min   Short Term Goals: Week 1:  PT Short Term Goal 1 (Week 1): Pt demonstrate supine<>sit with mod assist PT Short Term Goal 2 (Week 1): Pt will demonstrate dynamic sitting balance with mod assist PT Short Term Goal 3 (Week 1): Pt will demonstrate slide board transfer with +1 assist  Skilled Therapeutic Interventions/Progress Updates:   Pt limited at times by pain in session with nearly all UE activities. BP WFL in session. Pt would continue to benefit from skilled PT services to increase functional mobility.  Therapy Documentation Precautions:  Precautions Precautions: Fall Precaution Comments: Monitor BP, ACEs, TEDs Restrictions Weight Bearing Restrictions: No RUE Weight Bearing: Non weight bearing Vital Signs: BP 125/80 Pulse 84 Pain: Pain Assessment Pain Assessment: 0-10 Pain Score: 5  Pain Type: Acute pain Pain Location: Neck Pain Orientation: Right Patients Stated Pain Goal: 2 Pain Intervention(s): Massage Mobility:  with cues for LE placement, technique with total assist +2 transfers Other Treatments:  Pt performs sitting balance with varying levels of back support for ~30' including active rest. UT release performed. Pt performs anterior weight shifts with and without UE support 2x10 each. Facilitation of diaphragm breathing performed in sitting and supine. Manually resisted diaphragm breathing performed in supine. Pt performs B/L weight shifts in sitting 2x10. Hip add, gastroc, HS, and hip ext stretches all performed 2x30"   See Function Navigator for Current Functional Status.   Therapy/Group: Individual Therapy  Christia ReadingKinney, Roshaun Pound G 01/07/2016, 3:03 PM
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
## 2016-01-07 NOTE — Progress Notes (Signed)
Pt has not voided today. Pt agreed to allow RN to scan him. Bladder scan showed 645. RN attempted to get pt to urinate but pt said he did not feel the urge. Pt will not allow RN to I&O cath him. Pt educated on importance of emptying bladder and risk for urinary infection and other medical issues. Pt not agreeable to I & O cath at this time. Will continue to monitor and encourage pt to void.

## 2016-01-07 NOTE — Progress Notes (Addendum)
Occupational Therapy Session Note  Patient Details  Name: Matthew [name] MRN: [id number] Date of Birth: 27-Feb-1991  Today's Date: 01/07/2016 OT Individual Time: 4098-11910915-1030 OT Individual Time Calculation (min): 75 min    Short Term Goals: Week 1:  OT Short Term Goal 1 (Week 1): Pt will complete BSC transfer with max A +1 with +2 for safety if needed OT Short Term Goal 2 (Week 1): Pt will dress UB with set-up assist.  OT Short Term Goal 3 (Week 1): Pt will tolerate 3 consecutive hours up in chair in order to increase functional activity tolerance OT Short Term Goal 4 (Week 1): Pt will don pants with max A  Skilled Therapeutic Interventions/Progress Updates:    Session One: Pt seen for OT session focusing on functional mobility, ADL re-training, and core strengthening/ stability. Pt asleep in supine upon arrival, and with encouragement agreeable to tx session. He declined bathing/dressing this morning. Focus on pt directing care with bed mobility, requiring min questioning cues for sequencing of steps. Assist provided for managing LEs off EOB, able to being trunk upright. Pt able to maintain static sitting balance with close supervision, an improvement from previous sessions. Max A sliding board transfer completed into w/c with +2 available for safety.  Pt declined bathing/dressing this session. Grooming completed at sink mod I once all needs in reach.    In therapy gym, focus on core strengthening/ stability exercises. From w/c, worked on anterior/ posterior core control, having pt come forward off back of w/c with focus on pt utilizing core more than UEs. Completed reaching task with pt having to reach forward to place clothes pin on clothes pin tree, required one UE support to maintain dynamic balance. Pt with L lean in chair pt stating due to weakness, however, is able to self correct with cues.    Pt returned to room at end of session, left set-up with meal tray and all needs in reach.     Therapy Documentation Precautions:  Precautions Precautions: Fall Precaution Comments: Monitor BP, ACEs, TEDs Restrictions Weight Bearing Restrictions: No  Pain: Pain Assessment Pain Assessment: 0-10 Pain Score: 4  Pain Type: Acute pain Pain Location: Arm Pain Orientation: Right Pain Frequency: Constant Pain Onset: On-going Patients Stated Pain Goal: 2 Pain Intervention(s): Repositioned  See Function Navigator for Current Functional Status.   Therapy/Group: Individual Therapy  [name], Vianne Grieshop C 01/07/2016, 6:47 AM

## 2016-01-08 ENCOUNTER — Inpatient Hospital Stay (HOSPITAL_COMMUNITY): Payer: Medicaid Other

## 2016-01-08 ENCOUNTER — Inpatient Hospital Stay (HOSPITAL_COMMUNITY): Payer: Medicaid Other | Admitting: Physical Therapy

## 2016-01-08 ENCOUNTER — Inpatient Hospital Stay (HOSPITAL_COMMUNITY): Payer: Self-pay | Admitting: Occupational Therapy

## 2016-01-08 LAB — CBC
HCT: 38.5 % — ABNORMAL LOW (ref 39.0–52.0)
HEMOGLOBIN: 12.5 g/dL — AB (ref 13.0–17.0)
MCH: 30.6 pg (ref 26.0–34.0)
MCHC: 32.5 g/dL (ref 30.0–36.0)
MCV: 94.1 fL (ref 78.0–100.0)
Platelets: 422 10*3/uL — ABNORMAL HIGH (ref 150–400)
RBC: 4.09 MIL/uL — AB (ref 4.22–5.81)
RDW: 13.5 % (ref 11.5–15.5)
WBC: 7.2 10*3/uL (ref 4.0–10.5)

## 2016-01-08 MED ORDER — BETHANECHOL CHLORIDE 10 MG PO TABS
10.0000 mg | ORAL_TABLET | Freq: Three times a day (TID) | ORAL | Status: DC
Start: 2016-01-08 — End: 2016-01-09
  Administered 2016-01-08 – 2016-01-09 (×3): 10 mg via ORAL
  Filled 2016-01-08 (×3): qty 1

## 2016-01-08 NOTE — Progress Notes (Signed)
Physical Therapy Session Note  Patient Details  Name: Matthew [name] MRN: [id number] Date of Birth: 1990/11/17  Today's Date: 01/08/2016 PT Individual Time: 1100-1200 PT Individual Time Calculation (min): 60 min   Short Term Goals: Week 1:  PT Short Term Goal 1 (Week 1): Pt demonstrate supine<>sit with mod assist PT Short Term Goal 2 (Week 1): Pt will demonstrate dynamic sitting balance with mod assist PT Short Term Goal 3 (Week 1): Pt will demonstrate slide board transfer with +1 assist  Skilled Therapeutic Interventions/Progress Updates:   Addressed some behavioral issues with patient this morning (using cell phone and not paying attention to therapist at start of session) and educated on importance of making the most of his time in therapy. Pt somewhat receptive.  Focused on simulated car transfer for initiation of d/c planning with mod assist for slideboard transfer and +2 present for safety with balance. Educated on differences in real car vs simulated transfer with recommendation to trial with real car upon closer to d/c. Pt required assist to manage BLE in and out of car but attempting to manage with UE (awaiting leg loops still at this time). In w/c worked on neuro re-ed to BLE to address motor activation - hip and quad noted on LLE (minimal ankle PF on R and trace quad activation). Performed transfer out of TIS w/c and then back into regular manual w/c for trial of w/c propulsion. Pt able to go 10', x 10', x 20' with cues for technique and limited due to RUE pain. End of session encouraged to stay up in w/c for trial during lunch and then nursing could get him back to bed if needed. Pt reluctantly agreeable.   Therapy Documentation Precautions:  Precautions Precautions: Fall Precaution Comments: Monitor BP, ACEs, TEDs    Pain: RUE pain - RN administering pain medication at end of session.   See Function Navigator for Current Functional Status.   Therapy/Group: Individual  Therapy  Karolee StampsGray, Azelie Noguera Darrol PokeBrescia  Caillou Minus B. Kawhi Diebold, PT, DPT  01/08/2016, 12:27 PM

## 2016-01-08 NOTE — Progress Notes (Signed)
Occupational Therapy Session Note  Patient Details  Name: Matthew SmartJonathan XXXLyons MRN: [id number] Date of Birth: [birth year]  Today's Date: 01/08/2016 OT Individual Time: 0950-1100 OT Individual Time Calculation (min): 70 min    Short Term Goals: Week 1:  OT Short Term Goal 1 (Week 1): Pt will complete BSC transfer with max A +1 with +2 for safety if needed OT Short Term Goal 2 (Week 1): Pt will dress UB with set-up assist.  OT Short Term Goal 3 (Week 1): Pt will tolerate 3 consecutive hours up in chair in order to increase functional activity tolerance OT Short Term Goal 4 (Week 1): Pt will don pants with max A  Skilled Therapeutic Interventions/Progress Updates:    Pt seen for OT ADL bathing/dressing session. Pt asleep in supine upon arrival, easily awoken and agreeable to tx session. Attempted to have pt pull self forward using bed rails to bring himself into long sitting position. However, after half hearted attempt, pt declined attempting any further, wishing to do it another day. LB bathing completed total A.  Focus on pt directing care for bed mobility, requiring min-mod questioning cues for sequencing. Once in full side lying position, pt able to reach to assist with pulling down pants.  Mod A required to bring LEs off EOB, pt able to use bed rails and bring trunk into upright position, assist required for adjusting hips.  Max A sliding board transfer completed to w/c. Once in w/c pt able to push up on arm rests to scoot back in chair. Grooming completed at sink with set-up. Attempted to have pt practice w/c push up as option for pressure relieving, however, pt unable to completely clear bottom for functional pressure relief. Pt left sitting up in w/c with hand off to PT.  Pt required significantly increased time and encouragement to initiate and complete tasks. Educated throughout session regarding OT goals, POC, importance of directing care and self advocacy, and d/c planning.      Therapy Documentation Precautions:  Precautions Precautions: Fall Precaution Comments: Monitor BP, ACEs, TEDs Restrictions Weight Bearing Restrictions: No RUE Weight Bearing: Non weight bearing Pain: Pain Assessment Pain Assessment: 0-10 Pain Score: 7  Pain Type: Acute pain Pain Location: Arm Pain Orientation: Right Pain Descriptors / Indicators: Aching Pain Frequency: Intermittent Pain Onset: On-going Patients Stated Pain Goal: 2 Pain Intervention(s): Medication (See eMAR)  See Function Navigator for Current Functional Status.   Therapy/Group: Individual Therapy  [name], Vieno Tarrant C 01/08/2016, 7:10 AM

## 2016-01-08 NOTE — Plan of Care (Signed)
Problem: SCI BOWEL ELIMINATION Goal: RH STG MANAGE BOWEL WITH ASSISTANCE STG Manage Bowel with total Assistance.  Outcome: Not Progressing Patient refusing bowel program. Per report family was in today and would not let staff educate.  Goal: RH STG SCI MANAGE BOWEL WITH MEDICATION WITH ASSISTANCE STG SCI Manage bowel with medication with total assistance.  Outcome: Not Progressing Refusing bowel program   Problem: SCI BLADDER ELIMINATION Goal: RH STG SCI MANAGE BLADDER PROGRAM W/ASSISTANCE With min assist  Outcome: Progressing Having to bladder scan. Refusing caths at this time per report

## 2016-01-08 NOTE — Progress Notes (Signed)
Physical Therapy Session Note  Patient Details  Name: Matthew SmartJonathan XXXLyons MRN: [id number] Date of Birth: 1990-11-17  Today's Date: 01/08/2016 PT Individual Time: 1400-1430 PT Individual Time Calculation (min): 30 min   Short Term Goals: Week 1:  PT Short Term Goal 1 (Week 1): Pt demonstrate supine<>sit with mod assist PT Short Term Goal 2 (Week 1): Pt will demonstrate dynamic sitting balance with mod assist PT Short Term Goal 3 (Week 1): Pt will demonstrate slide board transfer with +1 assist  Skilled Therapeutic Interventions/Progress Updates:    Pt in bed, sleepy and lethargic. Declined OOB until next PT session. Pt request to just stay in bed. Focused on education with patient in regards to getting home measurement sheet filled out, handout given on ramp (pt reports he does not know if his mom is planning to put in a ramp), and reviewed SCI education notebook specifically on what information is in there and educated in detail on Autonomic Dysreflexia. Pt verbalized understanding but will benefit from further reinforcement. Handouts left in notebook at bedside for patient to read over on his own time.  Therapy Documentation Precautions:  Precautions Precautions: Fall Precaution Comments: Monitor BP, ACEs, TEDs    Pain: Pt reports pain in RUE - premedicated.  See Function Navigator for Current Functional Status.   Therapy/Group: Individual Therapy  Karolee StampsGray, Yash Cacciola Darrol PokeBrescia  Aadvika Konen B. Blayne Garlick, PT, DPT  01/08/2016, 3:14 PM

## 2016-01-08 NOTE — Progress Notes (Signed)
Physical Therapy Session Note  Patient Details  Name: Matthew [name] MRN: [id number] Date of Birth: April 05, 1991  Today's Date: 01/08/2016 PT Individual Time: [id number] PT Individual Time Calculation (min): 71 min   Short Term Goals: Week 1:  PT Short Term Goal 1 (Week 1): Pt demonstrate supine<>sit with mod assist PT Short Term Goal 2 (Week 1): Pt will demonstrate dynamic sitting balance with mod assist PT Short Term Goal 3 (Week 1): Pt will demonstrate slide board transfer with +1 assist  Skilled Therapeutic Interventions/Progress Updates:   Pt received in bed resting after previous PT session.  Pt reporting pain but willing to work on sitting balance in gym.  Pt reporting need to use urinal but asking if NT can assist him.  Once NT present, pt given time to attempt to urinate in urinal and then for NT to perform bladder scan.  Performed rolling to L side with bed rail and mod A and performed side > sit with mod A to lower LE.  Performed slideboard transfers bed > w/c <> mat with max A and verbal cues for lateral lean/weight shifting and max verbal cues for sequencing and w/c set up for transfer; pt required max encouragement to initiate set up.  Once on mat performed dynamic sitting balance training with alternating UE exercises with 3lb dumb bell: 8 reps each bicep curls, tricep extensions, shoulder ER, alternating reaching and placing weight from mat <> table + bilat UE shoulder flexion to lift weight off of table for increased activation of trunk with min-mod A to maintain balance, head and trunk position.  Pt reported slight increase in pain with tricep extensions.  Returned to w/c and to bed with slideboard and max A.  Once in bed pt performed rolling L and R for repositioning of shorts/brief with mod A.  Pt left with UE and LE supported on pillows and all items within reach.  Therapy Documentation Precautions:  Precautions Precautions: Fall Precaution Comments: Monitor BP, ACEs,  TEDs Restrictions Weight Bearing Restrictions: No Vital Signs: Therapy Vitals Temp: 97.9 F (36.6 C) Temp Source: Oral Pulse Rate: (!) 58 Resp: 16 BP: 95/62 mmHg Patient Position (if appropriate): Lying Oxygen Therapy SpO2: 100 % O2 Device: Not Delivered Pain: Pain Assessment Pain Assessment: 0-10 Pain Score: 6  Pain Type: Acute pain Pain Location: Shoulder Pain Orientation: Right Pain Descriptors / Indicators: Sharp Pain Onset: With Activity Pain Intervention(s): Repositioned;Rest   See Function Navigator for Current Functional Status.   Therapy/Group: Individual Therapy  Edman CircleHall, Silvanna Ohmer [facility] 01/08/2016, 4:47 PM
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
## 2016-01-08 NOTE — Progress Notes (Signed)
Recreational Therapy Session Note  Patient Details  Name: Matthew [name] MRN: [id number] Date of Birth: Jun 23, 1991 Today's Date: 01/08/2016  Pt placed on HOLD for TR services at this time.  Will continue to monitor through team for future participation.  Jamahl Lemmons 01/08/2016, 3:14 PM

## 2016-01-08 NOTE — Progress Notes (Signed)
Pt hasn't voided in 6 hours. RN encouraged patient to void and patient got mad. He said he was tired of having the bladder scan gel on him, and tired of hearing the same thing from the RN's about needing to pee. Patient bladder scanned for 399. Pt attempted to void for 15 minutes with no results. Denies any discomfort. Educated on I&O cath, but refusing. Will continue to monitor. Royston CowperIsley, Carli Lefevers E, RN

## 2016-01-09 ENCOUNTER — Inpatient Hospital Stay (HOSPITAL_COMMUNITY): Payer: Medicaid Other | Admitting: Occupational Therapy

## 2016-01-09 ENCOUNTER — Inpatient Hospital Stay (HOSPITAL_COMMUNITY): Payer: Self-pay | Admitting: Occupational Therapy

## 2016-01-09 ENCOUNTER — Inpatient Hospital Stay (HOSPITAL_COMMUNITY): Payer: Self-pay

## 2016-01-09 LAB — URINE CULTURE

## 2016-01-09 MED ORDER — BETHANECHOL CHLORIDE 25 MG PO TABS
25.0000 mg | ORAL_TABLET | Freq: Three times a day (TID) | ORAL | Status: DC
  Administered 2016-01-09 – 2016-01-11 (×6): 25 mg via ORAL
  Filled 2016-01-09 (×6): qty 1

## 2016-01-09 MED ORDER — BISACODYL 10 MG RE SUPP
10.0000 mg | Freq: Every day | RECTAL | Status: DC
  Filled 2016-01-09 (×4): qty 1

## 2016-01-09 MED ORDER — POLYETHYLENE GLYCOL 3350 17 G PO PACK
17.0000 g | PACK | Freq: Two times a day (BID) | ORAL | Status: DC
  Administered 2016-01-09 – 2016-01-26 (×21): 17 g via ORAL
  Filled 2016-01-09 (×38): qty 1

## 2016-01-09 NOTE — Plan of Care (Signed)
Problem: SCI BOWEL ELIMINATION Goal: RH STG MANAGE BOWEL WITH ASSISTANCE STG Manage Bowel with total Assistance.  Outcome: Not Progressing Patient refusing bowel program

## 2016-01-09 NOTE — Progress Notes (Signed)
Occupational Therapy Session Note  Patient Details  Name: Matthew [name] MRN: [id number] Date of Birth: Oct 08, 1990  Today's Date: 01/09/2016 OT Individual Time: 1300-1355 OT Individual Time Calculation (min): 55 min    Short Term Goals: Week 1:  OT Short Term Goal 1 (Week 1): Pt will complete BSC transfer with max A +1 with +2 for safety if needed OT Short Term Goal 2 (Week 1): Pt will dress UB with set-up assist.  OT Short Term Goal 3 (Week 1): Pt will tolerate 3 consecutive hours up in chair in order to increase functional activity tolerance OT Short Term Goal 4 (Week 1): Pt will don pants with max A  Skilled Therapeutic Interventions/Progress Updates:    Pt seen for OT session focusing on functional transfers, positioning, and ADL re-training. Pt sitting up in w/c upon arrival, voicing increased fatigue however stating he was willing to attempt therapy. In therapy gym educated and demonstrated long sitting vs. Circle sitting for LB dressing/bathing. Pt kept eyes closed during education, however once addressed pt stated he was too tired to do it and requested to return to room.  He completed max A slide board to bed with +2 to return to supine. Performed LE stretches for hipflexor and hamstrings in prep for LB dressing task.   Pt able to be placed in circle sit position. HOB elevated and pt able to manage B LEs in don/doff B socks with increased time and VCs for technique.  Pt cont to require increased time and encouragement to participate in session. Cont Education  Regarding OT goals, POC, importance of pt participation and advoacy, and OT goals. Pt's friend present for part of session that took place in pt's room. Educated him regarding LE stretches and referenced sheet given to pt by PT, he voiced understanding and willingness to perform stretches. Pt's friend offered encouragement to pt throughout session which seemed to help, however, pt remains with flat affect throughout session.    Therapy Documentation Precautions:  Precautions Precautions: Fall Precaution Comments: Monitor BP, ACEs, TEDs Restrictions Weight Bearing Restrictions: No RUE Weight Bearing: Non weight bearing Pain: Pain Assessment Pain Score: 2   See Function Navigator for Current Functional Status.   Therapy/Group: Individual Therapy  [name], Jordann Grime C 01/09/2016, 2:21 PM

## 2016-01-09 NOTE — Plan of Care (Signed)
Problem: SCI BLADDER ELIMINATION Goal: RH STG MANAGE BLADDER WITH ASSISTANCE STG Manage Bladder With total Assistance  Outcome: Not Progressing Refusing I&O caths    Goal: RH STG MANAGE BLADDER WITH EQUIPMENT WITH ASSISTANCE STG Manage Bladder With Equipment With moderate Assistance  Outcome: Not Progressing Refusing I&O caths. Repeated education
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
## 2016-01-09 NOTE — Progress Notes (Signed)
Occupational Therapy Session Note  Patient Details  Name: Matthew [name] MRN: [id number] Date of Birth: 06/23/1991  Today's Date: 01/09/2016 OT Individual Time: 0830-1000 OT Individual Time Calculation (min): 90 min    Short Term Goals: Week 1:  OT Short Term Goal 1 (Week 1): Pt will complete BSC transfer with max A +1 with +2 for safety if needed OT Short Term Goal 2 (Week 1): Pt will dress UB with set-up assist.  OT Short Term Goal 3 (Week 1): Pt will tolerate 3 consecutive hours up in chair in order to increase functional activity tolerance OT Short Term Goal 4 (Week 1): Pt will don pants with max A  Skilled Therapeutic Interventions/Progress Updates:    Pt seen for skilled OT to facilitate trunk control, UE strength, activity tolerance and functional mobility. Pt declined B/D stating he was bathed last night and had on clean clothes. Pt attempted to use urinal but did not have to go. Therapist applied TEDS and ACE wraps. Pt wanted to use bedrails to roll and sit to EOB but stated he did not want to use a hospital bed at home.  Pt instructed to work on rolling using head and upper trunk and then pushing up with L arm to sit. Pt able to sit to edge with max A to fully push up.  Pt did not like trying this method , prefers rails but again want to use a regular bed at home.  Transferred to w/c via slide board to complete grooming at the sink. Pt taken to gym to work on w/c >< mat transfers with slide board. On mat, various exercises for trunk control with reaching, AROM of R arm, lateral leans activating triceps with press up, hip adduction using soft gym ball.  Pt able to sit statically with close S. Pt tolerated gentle slow movements of R arm. Pt taken back to room at end of session with all needs met.   Therapy Documentation Precautions:  Precautions Precautions: Fall Precaution Comments: Monitor BP, ACEs, TEDs Restrictions Weight Bearing Restrictions: No RUE Weight Bearing: Non  weight bearing  Pain: Pain Assessment Pain Assessment: 0-10 Pain Score: 8  Pain Type: Acute pain Pain Location: Shoulder Pain Orientation: Right Pain Descriptors / Indicators: Aching Pain Frequency: Constant Pain Onset: On-going Patients Stated Pain Goal: 4 Pain Intervention(s): Medication (See eMAR) ADL:   See Function Navigator for Current Functional Status.   Therapy/Group: Individual Therapy  [place] 01/09/2016, 8:29 AM

## 2016-01-09 NOTE — Progress Notes (Signed)
Occupational Therapy Session Note  Patient Details  Name: Matthew SmartJonathan XXXLyons MRN: [id number] Date of Birth: 08-12-1990  Today's Date: 01/09/2016 OT Individual Time: 1415-1500 OT Individual Time Calculation (min): 45 min    Skilled Therapeutic Interventions/Progress Updates:    Pt transferred from supine to sit with max assist, working on guiding therapist in how he needs to assist with bed mobility.  Pt able to maintain static sitting EOB with close supervision and then transfer to the wheelchair with max assist using sliding board.  Pt's brother Tereasa CoopBraxton present for session as well and eager to help as needed.  Utilized standing frame for weightbearing thru LEs.  He was able to tolerate standing for 22 mins without any adverse reactions.  Attempted to have pt focus on hip extension and knee extension as well in standing.  Total assist for hip extension and midline orientation if standing assist was slightly lowered and pt worked on active movement without as much support.  Took pt back to room at end of session and left up in wheelchair with safety belt in place.  Pt's brother also present in room as well.    Therapy Documentation Precautions:  Precautions Precautions: Fall Precaution Comments: Monitor BP, ACEs, TEDs Restrictions Weight Bearing Restrictions: No RUE Weight Bearing: Non weight bearing  Vital Signs: Therapy Vitals Temp: 98.4 F (36.9 C) Temp Source: Oral Pulse Rate: 71 Resp: 18 BP: 109/65 mmHg Patient Position (if appropriate): Standing Oxygen Therapy SpO2: 100 % O2 Device: Not Delivered Pain: Pain Assessment Pain Assessment: Faces Pain Score: 2  Faces Pain Scale: Hurts a little bit Pain Type: Acute pain Pain Location: Shoulder Pain Orientation: Right Pain Descriptors / Indicators: Discomfort Pain Intervention(s): Repositioned ADL: See Function Navigator for Current Functional Status.   Therapy/Group: Individual Therapy  Clydell Alberts OTR/L 01/09/2016,  3:46 PM

## 2016-01-09 NOTE — Progress Notes (Signed)
Physical Therapy Session Note  Patient Details  Name: Matthew [name] MRN: [id number] Date of Birth: Apr 17, 1991  Today's Date: 01/09/2016 PT Individual Time: 1100-1200 PT Individual Time Calculation (min): 60 min   Short Term Goals: Week 1:  PT Short Term Goal 1 (Week 1): Pt demonstrate supine<>sit with mod assist PT Short Term Goal 2 (Week 1): Pt will demonstrate dynamic sitting balance with mod assist PT Short Term Goal 3 (Week 1): Pt will demonstrate slide board transfer with +1 assist  Skilled Therapeutic Interventions/Progress Updates:    Session focused on w/c mobility training (supervision to mod assist today due to RUE pain limiting), functional slideboard transfers (initiated pt attempting to remove board) with focus on technique, and neuro re-ed for addressing posture, balance, propped on elbow <> sit (to address for bed mobility as well as slideboard placement), and trunk control. Mirror placed in front of patient to help with visual feedback due to L cervical flexion, R shoulder hike and trunk flexed. Instructed in postural exercises x 10 reps each with facilitation at scapula to tolerance (due to RUE pain) in seated position. Educated pt on option for trial for standing (using standing frame) in future session per his request and pt agreeable. Pt performed transfers with overall mod assist during this session with slideboard on level surface with assist for placement/removal. Pt with questions for MD about staple removal in neck - will address in team conference.  Therapy Documentation Precautions:  Precautions Precautions: Fall Precaution Comments: Monitor BP, ACEs, TEDs  Pain: C/o RUE pain throughout session - limited to tolerance. RN notified.   See Function Navigator for Current Functional Status.   Therapy/Group: Individual Therapy  Karolee StampsGray, Francys Bolin Darrol PokeBrescia  Tess Potts B. Eda Magnussen, PT, DPT  01/09/2016, 12:16 PM

## 2016-01-09 NOTE — Plan of Care (Signed)
Problem: SCI BOWEL ELIMINATION Goal: RH STG MANAGE BOWEL WITH ASSISTANCE STG Manage Bowel with total Assistance.  Outcome: Not Progressing Refusing bowel program. Mother not coming in for education    Goal: RH STG SCI MANAGE BOWEL WITH MEDICATION WITH ASSISTANCE STG SCI Manage bowel with medication with total assistance.  Outcome: Not Progressing Pt refusing bowel program    Goal: RH STG SCI MANAGE BOWEL PROGRAM W/ASSIST OR AS APPROPRIATE STG SCI Manage bowel program w/min assist or as appropriate.  Outcome: Not Progressing Pt refusing bowel program. Repeated education

## 2016-01-10 ENCOUNTER — Inpatient Hospital Stay (HOSPITAL_COMMUNITY): Payer: Medicaid Other | Admitting: Physical Therapy

## 2016-01-10 ENCOUNTER — Inpatient Hospital Stay (HOSPITAL_COMMUNITY): Payer: Medicaid Other | Admitting: Occupational Therapy

## 2016-01-10 ENCOUNTER — Inpatient Hospital Stay (HOSPITAL_COMMUNITY): Payer: Self-pay | Admitting: Occupational Therapy

## 2016-01-10 NOTE — Progress Notes (Addendum)
Patient A/O no noted distress. Dressing change was completed. Encouraged patient to intake more fluids, resulted dark urine. Educated patient on In and Out cath, he understands the concept. Patient stated he wanted to speak with social worker about ground pass. Tolerated meds well. Dressing changes administered today. Patient behavior approachable through out the day. Patient shared his musical video and he is very proud of them. He refuses suppository. Staff will continue to monitor and meet needs. Patient transferred self from chair to bed with only assistance with putting sliding board under him. Patient master the transferred
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
## 2016-01-10 NOTE — Progress Notes (Addendum)
Occupational Therapy Session Note  Patient Details  Name: Matthew [name] MRN: [id number] Date of Birth: 02-Mar-1991  Today's Date: 01/10/2016 OT Individual Time: 0900-1000 OT Individual Time Calculation (min): 60 min    Skilled Therapeutic Interventions/Progress Updates:    Therapist donned ace wraps and knee high TEDs for BLEs as well as leg loops.  Pt worked on bending up both LEs with use of leg loops for bed mobility with mod assist in preparation for rolling.  He was able to transition LEs off of the bed with min assist with use of leg loops.  Max assist for sliding board transfers to the wheelchair with mod instructional cueing for head/hips relationship and to maintain at least one UE on the board at all times.  Pt completed grooming tasks of washing face and oral hygiene in sitting from wheelchair level.  Pt left in chair at bedside to work on breakfast.     Therapy Documentation Precautions:  Precautions Precautions: Fall Precaution Comments: Monitor BP, ACEs, TEDs Restrictions Weight Bearing Restrictions: No RUE Weight Bearing: Weight bearing as tolerated  Pain: Pain Assessment Pain Assessment: Faces Pain Score: 7  Faces Pain Scale: Hurts a little bit Pain Type: Acute pain Pain Location: Shoulder Pain Orientation: Right Pain Intervention(s): Repositioned;Ambulation/increased activity ADL: See Function Navigator for Current Functional Status.   Therapy/Group: Individual Therapy  Matthew [name] OTR/L 01/10/2016, 12:54 PM

## 2016-01-10 NOTE — Progress Notes (Signed)
Patient states he needs to "pee, real bad." Bladder scanned for 243. Patient attempted to urinate without success. Refuses even the idea of an I&O cath schedule or use. Patient states he will re-attempt to void later.

## 2016-01-10 NOTE — Plan of Care (Signed)
Problem: SCI BOWEL ELIMINATION Goal: RH STG MANAGE BOWEL WITH ASSISTANCE STG Manage Bowel with total Assistance.  Outcome: Not Progressing Pt refusing bowel program. Will only take scheduled medications

## 2016-01-10 NOTE — Progress Notes (Signed)
Pt voided 350ml and PVR for 33ml. Urine dark and malodorous. Pt very snappy, and rude to staff. RN talked to patient about how he is refusing and complaining about treatments. RN explained the risk involved with patient refusing to void/scan/ cath, and his bowel program. RN told patient that staff is here to help, not to annoy him and that our ultimate goal is to get him to a level where he can go home. He agreed that is what he wants.  RN said he needs to get more involved in his care and not refuse treatments so we can get him to that level. He said he would. He admits to being upset with his current situation, but denies needing to talk to anyone about his status. Will continue to monitor. Matthew [name], Casin Federici E, RN

## 2016-01-10 NOTE — Progress Notes (Signed)
Physical Therapy Session Note  Patient Details  Name: Matthew [name] MRN: [id number] Date of Birth: 07-30-1991  Today's Date: 01/10/2016 PT Individual Time: 1100-1200 PT Individual Time Calculation (min): 60 min   Short Term Goals: Week 1:  PT Short Term Goal 1 (Week 1): Pt demonstrate supine<>sit with mod assist PT Short Term Goal 2 (Week 1): Pt will demonstrate dynamic sitting balance with mod assist PT Short Term Goal 3 (Week 1): Pt will demonstrate slide board transfer with +1 assist  Skilled Therapeutic Interventions/Progress Updates:    Pt received in bed & agreeable to PT, noting 5-7/10 R shoulder pain but agreeable to PT. Pt reports he was premedicated prior to PT arrival and did not want more pain medication. Session focused on w/c mobility in hallway. Pt able to self propel 120 ft at once and additional 120 ft with multiple rest breaks. Pt required Min A with intermittent supervision to help propel along liner path as pt would veer to R due to R shoulder pain. Pt could self correct w/c but required assistance 2/2 pain. Provided pt with w/c gloves to cushion hands. Educated pt on pushing one wheel forward while simultaneously pulling back on opposite wheel to complete turn in narrow space. Pt would benefit from continued instruction on this. At end of session pt returned to room & left in w/c with all needs within reach.   Therapy Documentation Precautions:  Precautions Precautions: Fall Precaution Comments: Monitor BP, ACEs, TEDs Restrictions Weight Bearing Restrictions: No RUE Weight Bearing: Weight bearing as tolerated  Pain: Pain Assessment Pain Assessment: 0-10 Pain Score: 5  (5-7) Pain Location:  (R shoulder) Pain Orientation: Right Pain Intervention(s):  (premedicated)   See Function Navigator for Current Functional Status.   Therapy/Group: Individual Therapy  [name] 01/10/2016, 12:55 PM
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
## 2016-01-10 NOTE — Progress Notes (Signed)
Occupational Therapy Session Note  Patient Details  Name: Matthew [name] MRN: [id number] Date of Birth: October 26, 1990  Today's Date: 01/10/2016 OT Individual Time: [id number] OT Individual Time Calculation (min): 55 min    Skilled Therapeutic Interventions/Progress Updates:    Started session with discussion of pressure relief every 30 mins while pt is in the wheelchair.  He initially stated that he hadn't been told about pressure relief, but after explaining it to him he stated that he was working on changing his position.  Unsure how accurate this is.  Had him roll himself to the therapy gym with supervision.  Pt needing increased time to complete secondary to having difficulty coordinating turning the chair.  Pt running into the door frame with his left foot on one occasion with pt saying "ouch" as he felt it.  Pt completed sliding board transfer to the mat from the wheelchair with min assist after placement of the board, with slight decline noted to mat surface.  Worked on sitting balance EOM during session while having pt reach laterally and forward for picking up clothespins and placing them on a horizontal bar.  Alternated reaching across to the right and left side while supporting himself with one arm on the mat and reaching with the other.  Min guard assist for balance to complete this and then reach forward to place them on the bar.  Had him work on taking them off of the bar and then flexing forward to place them back in a container.  Pt able to maintain balance overall with min assist dynamically with reaching tasks and supervision statically with UE support and without.  Transferred back to wheelchair with mod assist via sliding board.  Pt left sitting in wheelchair at end of session.  Emphasized the importance of having family bring up some shoes as he feels pain in his foot if therapist presses his shoe up against it for stabilization with transfers.  He reports that his shoes will likely not  fit secondary to foot swelling and having the TEDS and ace wraps.  States he will get someone to bring in some if he can so we can try them.      Therapy Documentation Precautions:  Precautions Precautions: Fall Precaution Comments: Monitor BP, ACEs, TEDs Restrictions Weight Bearing Restrictions: No RUE Weight Bearing: Weight bearing as tolerated  Vital Signs: Therapy Vitals Temp: 98.2 F (36.8 C) Temp Source: Oral Pulse Rate: 64 Resp: 18 BP: 107/60 mmHg Patient Position (if appropriate): Sitting Oxygen Therapy SpO2: 100 % O2 Device: Not Delivered Pain: Pain Assessment Pain Assessment: 0-10 Pain Score: 5  (5-7) Faces Pain Scale: No hurt Pain Location:  (R shoulder) Pain Intervention(s):  (premedicated)  See Function Navigator for Current Functional Status.   Therapy/Group: Individual Therapy  Clay Menser OTR/L 01/10/2016, 3:58 PM
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
## 2016-01-10 NOTE — Progress Notes (Addendum)
Physical Therapy Session Note  Patient Details  Name: Matthew [name] MRN: [id number] Date of Birth: 01-28-1991  Today's Date: 01/10/2016 PT Individual Time: 1100-1200 PT Individual Time Calculation (min): 60 min   Short Term Goals: Week 1:  PT Short Term Goal 1 (Week 1): Pt demonstrate supine<>sit with mod assist PT Short Term Goal 2 (Week 1): Pt will demonstrate dynamic sitting balance with mod assist PT Short Term Goal 3 (Week 1): Pt will demonstrate slide board transfer with +1 assist  Skilled Therapeutic Interventions/Progress Updates:    Pt presented in w/c.  Encouraged manual propulsion from room to gym, x1 cue provided for negotiating obstacle. Slide board transfer to NuStep with maxA , leg stabilizers applied, seat 12, arm length 11, L4 for 10 min with break at 5 min. Therapeutic break given at 5 min.  Pt stating no increase in pain/discomfort with use of NuStep.  After NuStep transferred w/c>mat to perform core strengthening and stabilization activities with mod tactile cues for level shoulders and cx spine in neutral.  Pt able to correct posture however unable to maintain without cues. Performed stabilization activities with reaching/lifting BUE while sitting with PT providing min/mod assist for support. Pt provided cues for increased lateral lean onto R elbow to facilitate application of SB.  Pt returned to room with call bell, phone, tray within reach.   Therapy Documentation Precautions:  Precautions Precautions: Fall Precaution Comments: Monitor BP, ACEs, TEDs Restrictions Weight Bearing Restrictions: No RUE Weight Bearing: Weight bearing as tolerated General:   Vital Signs:  Pain: Pain Assessment Pain Assessment: Faces Pain Score: 7  Faces Pain Scale: Hurts a little bit Pain Type: Acute pain Pain Location: Shoulder Pain Orientation: Right Pain Intervention(s): Repositioned;Ambulation/increased activity   See Function Navigator for Current Functional  Status.   Therapy/Group: Individual Therapy  Johniece Hornbaker 01/10/2016, 1:06 PM

## 2016-01-10 NOTE — Progress Notes (Signed)
Occupational Therapy Weekly Progress Note  Patient Details  Name: Matthew [name] MRN: [id number] Date of Birth: September 12, 1990  Beginning of progress report period: January 04, 2016 End of progress report period: January 10, 2016  Today's Date: 01/10/2016 OT Individual Time: 0900-1000 OT Individual Time Calculation (min): 60 min    Patient has met 2 of 4 short term goals.  Pt cont to require min A with UB dressing due to decreased strength and balance to come forward off back of w/c to pull shirt down in the back. He cont to required increased assist with LB dressing, currently max- total A +2.  Pt making slow but steady progress towards OT goals, however, cont to require increased encouragement for participation in therapy sessions with continual education of OT goals, CIR, POC, and importance of participation and effort during therapy sessions.   Patient continues to demonstrate the following deficits: abnormal posture, acute pain, muscle weakness (generalized), pain in thoracic spine and paraparesis at level T-1 and therefore will continue to benefit from skilled OT intervention to enhance overall performance with BADL and Reduce care partner burden.  Patient progressing toward long term goals..  Continue plan of care.  OT Short Term Goals Week 1:  OT Short Term Goal 1 (Week 1): Pt will complete BSC transfer with max A +1 with +2 for safety if needed OT Short Term Goal 1 - Progress (Week 1): Met OT Short Term Goal 2 (Week 1): Pt will dress UB with set-up assist.  OT Short Term Goal 2 - Progress (Week 1): Not met OT Short Term Goal 3 (Week 1): Pt will tolerate 3 consecutive hours up in chair in order to increase functional activity tolerance OT Short Term Goal 3 - Progress (Week 1): Met OT Short Term Goal 4 (Week 1): Pt will don pants with max A OT Short Term Goal 4 - Progress (Week 1): Not met Week 2:  OT Short Term Goal 1 (Week 2): Pt will complete LB dressing in circle sit with mod A OT  Short Term Goal 2 (Week 2): Pt will position self in circle sit with min A in prep for LB dressing OT Short Term Goal 3 (Week 2): Pt will wash LE with min A OT Short Term Goal 4 (Week 2): Pt will dress UB in w/c position with set-up  OT Short Term Goal 5 (Week 2): Pt will transfer to [facility] with max A +1  Skilled Therapeutic Interventions/Progress Updates:    Pt seen for OT ADL session focusing on functional mobility/ positioning, ADL re-training, and education. Pt asleep in supine upon arrival, requiring increased encouragement for participation. He voiced "pain all over", RN reporting pain medicine just administered. Set him up with items for bathing/dressing and once began educated and having pt get himself in circle sitting position, pt became agitated questioning why therapist was making him do this and asking "why don't you just bathe me, why do I have to do this?" Educated regarding role of OT, OT goals, and importance of pt participating in therapies. Pt admitted to just "going through the motions of therapy" and educated regarding importance of him trying and working hard in therapies if it is his goal to return home and to be as independent as possible.  Following this discussion pt slightly more involved with therapy and bathing during this session. He required increased assist for management/ positioning of B LEs due to increased tone and spasms this morning. He was provided with custom leg loops and  educated regarding use/ technique for use, however, once donned pt stated he wanted them off and attempt to manage LEs without loops.  During rolling to complete buttock hygiene observed small breakdown on bottom. RN made aware and assessed, suspecting it being due to sheering vs. Pressure.  Pt left in supine in prep for next tx session.  During tx session he voiced need to urinate. Provided with hand held urinal, however, was unable to urinate. He refused nursing to scan him or preform in/out  cathing.   Therapy Documentation Precautions:  Precautions Precautions: Fall Precaution Comments: Monitor BP, ACEs, TEDs Restrictions Weight Bearing Restrictions: No RUE Weight Bearing: Non weight bearing  See Function Navigator for Current Functional Status.   Therapy/Group: Individual Therapy  [name], Matthew Hartstein C 01/10/2016, 7:10 AM

## 2016-01-11 ENCOUNTER — Inpatient Hospital Stay (HOSPITAL_COMMUNITY): Payer: Medicaid Other

## 2016-01-11 ENCOUNTER — Inpatient Hospital Stay (HOSPITAL_COMMUNITY): Payer: Medicaid Other | Admitting: Occupational Therapy

## 2016-01-11 ENCOUNTER — Inpatient Hospital Stay (HOSPITAL_COMMUNITY): Payer: Self-pay

## 2016-01-11 MED ORDER — SORBITOL 70 % SOLN
60.0000 mL | Status: AC
  Administered 2016-01-11: 60 mL via ORAL
  Filled 2016-01-11: qty 60

## 2016-01-11 MED ORDER — BETHANECHOL CHLORIDE 25 MG PO TABS
25.0000 mg | ORAL_TABLET | Freq: Four times a day (QID) | ORAL | Status: DC
  Administered 2016-01-11 – 2016-01-31 (×71): 25 mg via ORAL
  Filled 2016-01-11 (×76): qty 1

## 2016-01-11 NOTE — Progress Notes (Signed)
Patient continues to refuse additional bladder scans and/or catheterization, despite being unable to urinate. Attempted to educate patient on urinary tract infections, urosepsis, etc. Patient non-receptive.

## 2016-01-11 NOTE — Progress Notes (Signed)
Occupational Therapy Session Note  Patient Details  Name: Matthew [name] MRN: [id number] Date of Birth: 04/13/1991  Today's Date: 01/11/2016 OT Individual Time: [id number] OT Individual Time Calculation (min): 45 min    Short Term Goals: Week 2:  OT Short Term Goal 1 (Week 2): Pt will complete LB dressing in circle sit with mod A OT Short Term Goal 2 (Week 2): Pt will position self in circle sit with min A in prep for LB dressing OT Short Term Goal 3 (Week 2): Pt will wash LE with min A OT Short Term Goal 4 (Week 2): Pt will dress UB in w/c position with set-up  OT Short Term Goal 5 (Week 2): [facility]  Skilled Therapeutic Interventions/Progress Updates: patient received supine in bed talking on phone but concurred to work on activities bed level.     He was able to complete lateral rolls with min A - with most help at the hip/thigh area due to decreased hip/LB strength.    He required max A to move himself supine to sitting up in bed.   He was able to don socks with use of leg bands (to circle sit and pull  Feet toward body within reach) with moderate assistance and extra time - he often required short rest breaks.  Patient tolerated activities and participated in the full session.     Therapy Documentation Precautions:  Precautions Precautions: Fall Precaution Comments: Monitor BP, ACEs, TEDs Restrictions Weight Bearing Restrictions: No RUE Weight Bearing: Non weight bearing  Pain:6/10n right upper arm and shoulder/neck area  Pain Assessment Pain Score: 5      See Function Navigator for Current Functional Status.   Therapy/Group: Individual Therapy  Bud Faceickett, Naela Nodal [facility] HospitalYeary 01/11/2016, 4:38 PM
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
## 2016-01-11 NOTE — Progress Notes (Signed)
Order for grounds pass obtained by day shift nurse from Dr. Riley KillSwartz. Patient informed of need for security to accompany him due to his triple x status. Security unable to provide assistance at this time. Patient expressed interest in removing triple x status. Patient educated on how easily information could be obtained if protective status is revoked. Patient noted to have several "home boys" in the room during conversation. Patient quick to request protective status be removed stating, "If they came up in here, it would be the worst mistake they ever made!" Patient's friend provided reality check and encouraged him to keep himself and the staff here protected. Patient thought for a minute and responded, "I guess I don't have a choice."

## 2016-01-11 NOTE — Progress Notes (Signed)
Patient able to urinate 420 ml of clear yellow urine without difficulty. Less difficulty noted in starting stream. Patient able to assist with bed mobility with moderate assistance from staff. Patient educated on importance of good skin integrity and repositioning. Verbalized understanding and agreeable to side lying position for bed.

## 2016-01-11 NOTE — Progress Notes (Signed)
Occupational Therapy Session Note  Patient Details  Name: Matthew [name] MRN: [id number] Date of Birth: October 14, 1990  Today's Date: 01/11/2016 OT Individual Time: 1100-1200 OT Individual Time Calculation (min): 60 min    Short Term Goals: Week 2:  OT Short Term Goal 1 (Week 2): Pt will complete LB dressing in circle sit with mod A OT Short Term Goal 2 (Week 2): Pt will position self in circle sit with min A in prep for LB dressing OT Short Term Goal 3 (Week 2): Pt will wash LE with min A OT Short Term Goal 4 (Week 2): Pt will dress UB in w/c position with set-up  OT Short Term Goal 5 (Week 2): [facility]  Skilled Therapeutic Interventions/Progress Updates:    Upon entering the room, pt seated in wheelchair and states, "I don't feel good." [facility] Maxi move sling placed and pt transferred onto drop arm commode chair with use of maxi move. Pt unable to have BM. Pt requesting to return to bed secondary to continuing to feel unwell. Pt transferred with +2 and use of maxi move for safety back into bed. From bed pt rolled L <> R with min A and use of bed rails and leg loops. OT assisted with clothing management. Family member present in room and remains at end of session. Call bell and all needed items within reach upon exiting the room.   Therapy Documentation Precautions:  Precautions Precautions: Fall Precaution Comments: Monitor BP, ACEs, TEDs Restrictions Weight Bearing Restrictions: No RUE Weight Bearing: Non weight bearing Pain: Pain Assessment Pain Assessment: 0-10 Pain Score: Asleep Pain Type: Neuropathic pain Pain Location: Generalized Pain Descriptors / Indicators: Aching Pain Frequency: Constant Pain Onset: On-going Patients Stated Pain Goal: 3 Pain Intervention(s): Medication (See eMAR);RN made aware;Repositioned;Ambulation/increased activity  See Function Navigator for Current Functional  Status.   Therapy/Group: Individual Therapy  [name], Faraz Ponciano L 01/11/2016, 12:29 PM

## 2016-01-11 NOTE — Progress Notes (Signed)
Patient slept well throughout the night. Continues to refuse both bowel and bladder programs. Stated he would take something for his bowels orally. Will notify MD on rounds.

## 2016-01-11 NOTE — Progress Notes (Signed)
Physical Therapy Weekly Progress Note  Patient Details  Name: Matthew [name] MRN: [id number] Date of Birth: 1991-07-17  Beginning of progress report period: Jan 03, 2016 End of progress report period: January 11, 2016  Today's Date: 01/11/2016 PT Individual Time: 1300-1345 PT Individual Time Calculation (min): 45 min  Session focused on functional bed mobility to get to EOB, sitting balance and lateral leans for toileting and hygiene/clothing management, and functional transfer to [facility] using slideboard (+2 for safety). Pt required encouragement throughout session for attempting things instead of letting therapist do it for him. Reports because he is feeling bad, he isn't able to do as much. Required max assist for transfer. End of session left with NT and RN to finish hygiene.    Patient has met 3 of 3 short term goals.  Pt is making progress functionally but limited by behavioral components and pain. Strong education has been provided throughout on progression, goals, home set-up, and medical components of SCI with limited carryover noted. D/c tentative for 6/23 with plan to d/c home with mother.  Patient continues to demonstrate the following deficits: paraparesis, decreased strength, decreased balance, pain, decreased activity tolerance, decreased postural control, abnormal tone and therefore will continue to benefit from skilled PT intervention to enhance overall performance with activity tolerance, balance, postural control, ability to compensate for deficits, awareness and coordination.  Patient progressing toward long term goals..  Continue plan of care.  PT Short Term Goals Week 1:  PT Short Term Goal 1 (Week 1): Pt demonstrate supine<>sit with mod assist PT Short Term Goal 1 - Progress (Week 1): Met PT Short Term Goal 2 (Week 1): Pt will demonstrate dynamic sitting balance with mod assist PT Short Term Goal 2 - Progress (Week 1): Met PT Short Term Goal 3 (Week 1): Pt will demonstrate  slide board transfer with +1 assist PT Short Term Goal 3 - Progress (Week 1): Met Week 2:  PT Short Term Goal 1 (Week 2): Pt will be able to progress transfers to overall min assist uneven surfaces PT Short Term Goal 2 (Week 2): Pt will be able to propel w/c x 150' with supervision to increase functional mobility and UE strength PT Short Term Goal 3 (Week 2): w/c evaluation will be initiated to prepare for discharge PT Short Term Goal 4 (Week 2): Pt will be able to initiate use of leg loops to manage BLE during mobility  Skilled Therapeutic Interventions/Progress Updates:  Community reintegration;DME/adaptive equipment instruction;Neuromuscular re-education;Psychosocial support;UE/LE Strength taining/ROM;Wheelchair propulsion/positioning;UE/LE Coordination activities;Therapeutic Activities;Functional electrical stimulation;Discharge planning;Balance/vestibular training;Functional mobility training;Patient/family education;Splinting/orthotics;Therapeutic Exercise;Pain management;Disease management/prevention;Skin care/wound management   Therapy Documentation Precautions:  Precautions Precautions: Fall Precaution Comments: Monitor BP, ACEs, TEDs  Pain: C/o pain in RUE and upset stomach - RN aware.   See Function Navigator for Current Functional Status.  Therapy/Group: Individual Therapy  Canary Brim Ivory Broad, PT, DPT  01/11/2016, 3:21 PM

## 2016-01-11 NOTE — Progress Notes (Addendum)
Occupational Therapy Session Note  Patient Details  Name: Matthew [name] MRN: [id number] Date of Birth: February 15, 1991  Today's Date: 01/11/2016 OT Individual Time: [id number]  OT Individual Time Calculation (min): 60 min    Short Term Goals: Week 2:  OT Short Term Goal 1 (Week 2): Pt will complete LB dressing in circle sit with mod A OT Short Term Goal 2 (Week 2): Pt will position self in circle sit with min A in prep for LB dressing OT Short Term Goal 3 (Week 2): Pt will wash LE with min A OT Short Term Goal 4 (Week 2): Pt will dress UB in w/c position with set-up  OT Short Term Goal 5 (Week 2): [facility]  Skilled Therapeutic Interventions/Progress Updates:    Pt seen for OT session focusing on functional sitting balance, activtity tolerance, ADL re-training, and w/c propulsion/ management. Pt in supine upon arrival, not engaging with therapist, however, willing to participate with therapy. Matthew [name] declined bathing/dressing this session. Total A to don TED hose, ACE wraps, leg loops and socks. Matthew [name] required min A to manage R LE despite use of leg loops due to R UE pain/ weakness to transfer to EOB.  Matthew [name] sat EOB with min A- close supervision to complete grooming task and for RN to administered medications and change UE bandages. Max A sliding board transfer completed to w/c with focus on pt initiating more of the transfer.  Matthew [name] completed oral care at the sink mod I. Pt self propelled w/c to therapy day room and had pt practice self propelling w/c in day room on carpeted surface and weaving through furniture to simulate home envoirment. Completed with increased time and min cuing for effective w/c propulsion techniques. Pt voiced not understanding point of this activity, stating "this is pointless, I'm only in therapy so I can walk and this isn't helping me to walk". Educated regarding current goals for w/c level ADLs/IADLs, task for UE strengthening/ endurance, and for w/c  management in home/ community setting. Pt returned to room at end of session, left in w/c with all needs in reach.   Therapy Documentation Precautions:  Precautions Precautions: Fall Precaution Comments: Monitor BP, ACEs, TEDs Restrictions Weight Bearing Restrictions: No RUE Weight Bearing: Non weight bearing Pain: Pain Assessment Pain Assessment: 0-10 Pain Score: Asleep Pain Type: Neuropathic pain Pain Location: Generalized Pain Descriptors / Indicators: Aching Pain Frequency: Constant Pain Onset: On-going Patients Stated Pain Goal: 3 Pain Intervention(s): Medication (See eMAR);RN made aware;Repositioned;Ambulation/increased activity  See Function Navigator for Current Functional Status.   Therapy/Group: Individual Therapy  [name], Curt Oatis C 01/11/2016, 7:15 AM

## 2016-01-11 NOTE — Progress Notes (Signed)
Occupational Therapy Note  Patient Details  Name: Matthew [name] MRN: [id number] Date of Birth: [birth year]  Today's Date: 01/11/2016 OT Individual Time: 1400-1430 OT Individual Time Calculation (min): 30 min   Pt c/o 6/10 pain in R shoulder; RN aware and repositioned Individual Therapy  Pt in bed upon arrival with RN and NT present.  Pt had just completed a bowel movement on BSC and was engaged in toileting tasks.  Pt required tot A for toileting tasks at bed level.  Initial focus on bed mobility to facilitate hygiene and pulling up pants.  Pt requested stretching for his legs.  Pt tolerated stretching of hamstrings, hip flexors, and quads.  Pt able to push against therapist when his hips and knees are flexed.  Pt states he can move his legs "a little" at night when the Teds and ace bandages have been removed.  Pt remained in bed with all needs with in reach.    Lavone NeriLanier, Matthew [name] [facility] 01/11/2016, 2:40 PM

## 2016-01-11 NOTE — Progress Notes (Signed)
Security unable to assist patient with grounds pass per their supervisor. Patient notified that he would be unable to leave unit this evening. Offered to leave message for social work and MD to see if something could be coordinated tomorrow. Patient agreeable to this. Patient expresses feelings of being "isolated" and "like a prisoner." Patient states he does "understand" why staff are concerned about safety.

## 2016-01-12 ENCOUNTER — Inpatient Hospital Stay (HOSPITAL_COMMUNITY): Payer: Medicaid Other | Admitting: Occupational Therapy

## 2016-01-12 ENCOUNTER — Inpatient Hospital Stay (HOSPITAL_COMMUNITY): Payer: Medicaid Other

## 2016-01-12 NOTE — Progress Notes (Signed)
Patient rested well during the night. Did allow assistance with repositioning. Able to verbalize understanding of need for position changes to prevent skin breakdown. Patient able to assist with bed mobility with min-mod assist. Right side remains stronger.  Voice message left with patient's social worker Valentina GuLucy regarding situation with grounds pass. Patient again verbalizes understanding of security risk, but states he really wants "out of this room." Patient very polite and appreciative of staff after limit setting early in shift. Encouraged to find someone or something to draw inspiration from to help motivate him to work harder in therapy and in doing things for himself. Patient stated he has a "baby on the way." Patient encouraged to use this as motivation to set/achieve personal goals.  No acute distress noted.

## 2016-01-12 NOTE — Progress Notes (Signed)
Occupational Therapy Session Note  Patient Details  Name: Ginnie SmartJonathan XXXLyons MRN: [id number] Date of Birth: October 12, 1990  Today's Date: 01/12/2016 OT Individual Time: [id number] OT Individual Time Calculation (min): 60 min    Short Term Goals: Week 2:  OT Short Term Goal 1 (Week 2): Pt will complete LB dressing in circle sit with mod A OT Short Term Goal 2 (Week 2): Pt will position self in circle sit with min A in prep for LB dressing OT Short Term Goal 3 (Week 2): Pt will wash LE with min A OT Short Term Goal 4 (Week 2): Pt will dress UB in w/c position with set-up  OT Short Term Goal 5 (Week 2): [facility]  Skilled Therapeutic Interventions/Progress Updates: Patient and nurse tech in procerss of brief change upon arrival.   Shirt and pants were wet as well.   So patient participated as follows for brief change and change of clothing:  Lateral rolls with max A for donning brief and pants with multiple rest breaks and c/o of somewhat tolerable pain in his right upper arm when bearing lateral weight onto his right side.    Patient also participated in lying supine and coming into circle sit position with max A (late in the afternoon-fatigue may have contribued to needing much help).  Also he required max assist for proper positiioning to move up into his bed and have pillows placed behind for low back and trunk support to sit and eat in safe position in his bed.    Therapy Documentation Precautions:  Precautions Precautions: Fall Precaution Comments: Monitor BP, ACEs, TEDs Restrictions Weight Bearing Restrictions: Yes RUE Weight Bearing: Non weight bearing  Pain: c/o in right upper arm when he laterally rolled onto right side of body when he attempted to pull up his pants during brief change.   He did not rate the pain.   See Function Navigator for Current Functional Status.   Therapy/Group: Individual Therapy  Bud Faceickett, Reha Martinovich [facility] Hospital[facility] 01/12/2016, 4:51  PM

## 2016-01-12 NOTE — Progress Notes (Signed)
Patient stated he has voided twice today during day shift, the last time being around 1630 when he was incontinent of urine. Patient's visitor confirmed he was incontinent at that time. Patient stated he does "not feel like (he) needs to urinate." Patient will call for assistance once he feels pressure. Education continued.

## 2016-01-12 NOTE — Progress Notes (Signed)
Physical Therapy Session Note  Patient Details  Name: Matthew SmartJonathan XXXLyons MRN: [id number] Date of Birth: 1991-02-28  Today's Date: 01/12/2016 PT Individual Time: 1300-1400 PT Individual Time Calculation (min): 60 min   Short Term Goals: Week 2:  PT Short Term Goal 1 (Week 2): Pt will be able to progress transfers to overall min assist uneven surfaces PT Short Term Goal 2 (Week 2): Pt will be able to propel w/c x 150' with supervision to increase functional mobility and UE strength PT Short Term Goal 3 (Week 2): w/c evaluation will be initiated to prepare for discharge PT Short Term Goal 4 (Week 2): Pt will be able to initiate use of leg loops to manage BLE during mobility  Skilled Therapeutic Interventions/Progress Updates:   Pt expressing that he doesn't want to leave here in a w/c. Educated on recovery and maintaining realistic expectations while on rehab. Pt unrealistic with expectation about being able to go home right now without a w/c. When asked how he would get around the house without it at this point, he said "I don't know but I don't need it." With encouragement, pt propelled w.c down to therapy gym for functional mobility and UE strengthening with supervision. Neuro re-ed in standing frame for weightbearing through BLE, postural control, and standing tolerance while performing functional reaching tasks and attempts at activating hip extension with sling slightly lowered with trace contraction noted. Pt able to tolerate standing 26 min without symptoms. End of session returned back to bed with min to mod assist for slideboard transfer, mod assist for bed mobility, and min assist rolling. Pt requires cues for technique and demonstrates little to no initiation to increase independence with overall mobility or w/c management.   Therapy Documentation Precautions:  Precautions Precautions: Fall Precaution Comments: Monitor BP, ACEs, TEDs Restrictions Weight Bearing Restrictions: Yes RUE  Weight Bearing: Non weight bearing  Pain: No complaints.   See Function Navigator for Current Functional Status.   Therapy/Group: Individual Therapy  Karolee StampsGray, [place] Segler Darrol PokeBrescia  Jetson Pickrel B. Barrett Holthaus, PT, DPT  01/12/2016, 3:02 PM

## 2016-01-12 NOTE — Progress Notes (Signed)
Patient stated to this RN that he voided with therapy early in the day but couldn't remember which therapist. This RN went to both therapist for verification and both stated that he did not voided with them. Patient then stated that he did not want to attempt to void. Patient educated on risk of not voiding at all during the day. Patient also refused evening suppository and again educated on necessity of bowel program.
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
## 2016-01-12 NOTE — Progress Notes (Signed)
Occupational Therapy Session Note  Patient Details  Name: Matthew [name] MRN: [id number] Date of Birth: February 11, 1991  Today's Date: 01/12/2016 OT Individual Time: 0845-1000 OT Individual Time Calculation (min): 75 min    Short Term Goals: Week 2:  OT Short Term Goal 1 (Week 2): Pt will complete LB dressing in circle sit with mod A OT Short Term Goal 2 (Week 2): Pt will position self in circle sit with min A in prep for LB dressing OT Short Term Goal 3 (Week 2): Pt will wash LE with min A OT Short Term Goal 4 (Week 2): Pt will dress UB in w/c position with set-up  OT Short Term Goal 5 (Week 2): [facility]  Skilled Therapeutic Interventions/Progress Updates:    Pt seen for OT session focusing on ADL re-training and functional sitting balance. Pt in supine upon arrival, and with increased time agreeable to therapy. He declined LB bathing/dressing wishing to get to sink to complete UB bathing/grooming. ACE wraps, TED hose and leg loops donned total A. With use of leg loops, pt able to transfer to EOB with close supervision and VCs for technique. Max A slide board transfer completed to w/c, pt able to assist with raising R LE with use of leg loops to place board.  He completed UB bathing/grooming completed with set-up at sink. In therapy gym, he completed min A sliding board transfer to therapy mat on slightly declined level. VCs provided for weightshift and technique and safety considerations when completing slide board transfers. He sat EOM with supervision to play connect 4 game, able to reach across midline and maintain dynamic balance. Progressed at end of session to using B UEs simultaneously and maintaining balance. Min A sliding board transfer completed to w/c. Pt left in room with all needs in reach, set-up with breakfast tray.  Pt slightly more engaged and interactive this session compared to others. Appeared somewhat happy/excited with improvements in sitting  balance and sliding board transfers compared to previous sessions.  Educated regarding purpose of TED hose and ACE wraps and progression of eliminating need for BP control items.   Therapy Documentation Precautions:  Precautions Precautions: Fall Precaution Comments: Monitor BP, ACEs, TEDs Restrictions Weight Bearing Restrictions: No RUE Weight Bearing: Non weight bearing Pain: Pain Assessment Pain Assessment: 0-10 Pain Score: 6  Pain Type: Acute pain;Neuropathic pain Pain Location: Generalized Pain Orientation: Right Pain Descriptors / Indicators: Aching;Constant Pain Frequency: Constant Pain Onset: On-going Patients Stated Pain Goal: 2 Pain Intervention(s): Medication (See eMAR), RN aware, repositioned, increased activity   See Function Navigator for Current Functional Status.   Therapy/Group: Individual Therapy  [name], Izyk Marty C 01/12/2016, 7:09 AM

## 2016-01-12 NOTE — Progress Notes (Signed)
Patient very anxious about staple removal, stating he doesn't want to be in pain. Patient tolerated removal of half of the staples in his neck incision before refusing any further removal due to discomfort. Patient stated he wants to be "numbed" or "knocked out." Will notify MD on rounds.

## 2016-01-13 ENCOUNTER — Inpatient Hospital Stay (HOSPITAL_COMMUNITY): Payer: Self-pay | Admitting: Physical Therapy

## 2016-01-13 MED ORDER — LIDOCAINE HCL 2 % EX GEL
CUTANEOUS | Status: DC | PRN
  Administered 2016-01-13: 5 via TOPICAL
  Filled 2016-01-13: qty 5

## 2016-01-13 NOTE — Progress Notes (Signed)
01/13/16 1635 nursing Patient finally agreed to take staples out from his back. Tolerated well.
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
## 2016-01-13 NOTE — Progress Notes (Signed)
Physical Therapy Session Note  Patient Details  Name: Matthew [name] XXXLyons MRN: [id number] Date of Birth: 1991/04/25  Today's Date: 01/13/2016 PT Individual Time: [id number] PT Individual Time Calculation (min): 47 min   Short Term Goals: Week 2:  PT Short Term Goal 1 (Week 2): Pt will be able to progress transfers to overall min assist uneven surfaces PT Short Term Goal 2 (Week 2): Pt will be able to propel w/c x 150' with supervision to increase functional mobility and UE strength PT Short Term Goal 3 (Week 2): w/c evaluation will be initiated to prepare for discharge PT Short Term Goal 4 (Week 2): Pt will be able to initiate use of leg loops to manage BLE during mobility  Skilled Therapeutic Interventions/Progress Updates:   Pt received in bed with friend present; security contacted and present to escort pt and PT outside for therapy.  Assisted pt with donning TEDS and ace wraps and providing stretches to bilat gastroc and hamstrings; pt demonstrating some active toe extension and knee extension in bed.  Performed rolling to R side with mod A to manage LE to EOB and min-mod A to bring trunk upright.  Performed slideboard to w/c with max A.  Pt transported outside in w/c where pt engaged in w/c mobility training up/down long ramp, over uneven pavement and in/out of automatic doors and over thresholds with min-mod A.  Provided pt one prolonged rest break outside where pt educated on issues specific to spinal cord-temperature regulation impairments outside-and recommendations for managing temperature.  Returned to room and pt left up in w/c with all items with in reach.  Pt much more interactive this session and grateful for the opportunity to go outside.    Therapy Documentation Precautions:  Precautions Precautions: Fall Precaution Comments: Monitor BP, ACEs, TEDs Restrictions Weight Bearing Restrictions: No RUE Weight Bearing: Non weight bearing Vital Signs: Therapy Vitals Temp: 97.9 F  (36.6 C) Temp Source: Oral Pulse Rate: 62 Resp: 16 BP: 114/61 mmHg Patient Position (if appropriate): Lying Oxygen Therapy SpO2: 100 % O2 Device: Not Delivered Pain: Pain Assessment Pain Score: 0-No pain  See Function Navigator for Current Functional Status.   Therapy/Group: Individual Therapy  Edman CircleHall, Eldon Zietlow [facility] Bell[facility] 01/13/2016, 4:22 PM

## 2016-01-13 NOTE — Plan of Care (Signed)
Problem: SCI BOWEL ELIMINATION Goal: RH STG MANAGE BOWEL WITH ASSISTANCE STG Manage Bowel with total Assistance.  Outcome: Not Progressing Patient refuses bowel prep  Problem: SCI BLADDER ELIMINATION Goal: RH STG MANAGE BLADDER WITH ASSISTANCE STG Manage Bladder With total Assistance  Outcome: Not Progressing Patient refuses cath/ scan; request for urinal

## 2016-01-13 NOTE — Progress Notes (Signed)
01/13/16 0930 nursing RN offered morning  meds to patient. Patient did not respond. RN tried again patient refused at this time. Patient agreed for RN to come back at later time. Per patient he is not in pain at the moment. MD aware.

## 2016-01-14 ENCOUNTER — Inpatient Hospital Stay (HOSPITAL_COMMUNITY): Payer: Medicaid Other | Admitting: *Deleted

## 2016-01-14 NOTE — Progress Notes (Signed)
01/14/16 1500 nursing Patient refused seatbelt on his wheelchair claims they have not been placing the seatbelt before. Call light placed within reach.

## 2016-01-14 NOTE — Progress Notes (Signed)
01/14/16 1700 nsg Patient refused for dressing change claims he will do it at night shift.

## 2016-01-14 NOTE — Plan of Care (Signed)
Problem: SCI BOWEL ELIMINATION Goal: RH STG MANAGE BOWEL WITH ASSISTANCE STG Manage Bowel with total Assistance.  Outcome: Not Progressing Refuses bowel program  Problem: SCI BLADDER ELIMINATION Goal: RH STG MANAGE BLADDER WITH ASSISTANCE STG Manage Bladder With total Assistance  Outcome: Progressing Patient is continent

## 2016-01-14 NOTE — Progress Notes (Signed)
Physical Therapy Session Note  Patient Details  Name: Ginnie SmartJonathan XXXLyons MRN: [id number] Date of Birth: 07/05/1991  Today's Date: 01/14/2016 PT Individual Time: 1330-1400 PT Individual Time Calculation (min): 30 min     Skilled Therapeutic Interventions/Progress Updates:  Patient in bed, agrees to therapy but needs to be encouraged to put down his phone in order to achieve any participation. Max A of 2 to don TED hose and ace wraps, rolling in bed with mod to max A due to low participation, coming to sit with max A. Patient guided through sliding board transfer to w/c with max A needed to perform.  W/c propulsion to and from gym with cues for active participation. In the gym attempted standing frame x2 with poor result due to pain in B LE, max standing time 1 min .  Patient returned to room, left with all needs within reach and no complains of pain or discomfort. At the end of session patient stated he wants more therapy. Educated on time allotted for session today and assured more therapy to come later in the week.   Therapy Documentation Precautions:  Precautions Precautions: Fall Precaution Comments: Monitor BP, ACEs, TEDs Restrictions Weight Bearing Restrictions: No RUE Weight Bearing: Non weight bearing   See Function Navigator for Current Functional Status.   Therapy/Group: Individual Therapy  Dorna MaiCzajkowska, Yvonne Stopher W 01/14/2016, 3:34 PM

## 2016-01-15 ENCOUNTER — Inpatient Hospital Stay (HOSPITAL_COMMUNITY): Payer: Medicaid Other | Admitting: Physical Therapy

## 2016-01-15 ENCOUNTER — Inpatient Hospital Stay (HOSPITAL_COMMUNITY): Payer: Medicaid Other | Admitting: Occupational Therapy

## 2016-01-15 ENCOUNTER — Encounter (HOSPITAL_COMMUNITY): Payer: Self-pay

## 2016-01-15 MED ORDER — TRAMADOL HCL 50 MG PO TABS
50.0000 mg | ORAL_TABLET | Freq: Four times a day (QID) | ORAL | Status: DC
  Administered 2016-01-15: 50 mg via ORAL
  Administered 2016-01-16 – 2016-01-17 (×5): 100 mg via ORAL
  Administered 2016-01-17: 50 mg via ORAL
  Administered 2016-01-18: 100 mg via ORAL
  Filled 2016-01-15 (×3): qty 2
  Filled 2016-01-15: qty 1
  Filled 2016-01-15 (×4): qty 2
  Filled 2016-01-15: qty 1

## 2016-01-15 NOTE — Progress Notes (Signed)
Occupational Therapy Session Note  Patient Details  Name: Matthew [name] MRN: [id number] Date of Birth: 10/03/1990  Today's Date: 01/15/2016 OT Individual Time: [id number] OT Individual Time Calculation (min): 72 min    Short Term Goals:  Week 2:  OT Short Term Goal 1 (Week 2): Pt will complete LB dressing in circle sit with mod A OT Short Term Goal 2 (Week 2): Pt will position self in circle sit with min A in prep for LB dressing OT Short Term Goal 3 (Week 2): Pt will wash LE with min A OT Short Term Goal 4 (Week 2): Pt will dress UB in w/c position with set-up  OT Short Term Goal 5 (Week 2): [facility]  Skilled Therapeutic Interventions/Progress Updates:    Upon entering the room, pt seated in wheelchair awaiting therapist arrival. Pt with 6/10 c/o pain in R shoulder this session but agreeable to OT intervention. Pt propelled wheelchair with increased time and supervision 75' to gym. Pt directed transfer from wheelchair >bed with min verbal guidance cues. Pt requiring verbal cues as well to remove leg rest for set up of transfer. Pt needed assistance to place slide board. Mod A for slide board wheelchair <>mat. Pt in supine on mat for stretching of B LEs in all planes with extra focus on hamstrings. Pt rolling to side and pushing up to sitting with max A. Helper assisted with sitting balance while Pt engaged in circle sitting with use of leg loops. Therapist helped hold LE in place while pt placed B shoes on feet. Pt returning to wheelchair in same manner as stated above while verbally directing transfer. OT assisted pt back into room secondary to fatigue. He remained in wheelchair with call bell and all needed items within reach.   Therapy Documentation Precautions:  Precautions Precautions: Fall Precaution Comments: Monitor BP, ACEs, TEDs Restrictions Weight Bearing Restrictions: Yes RUE Weight Bearing: Non weight bearing Pain: Pain Assessment Pain  Assessment: 0-10 Pain Score: 6  Pain Type: Acute pain;Neuropathic pain Pain Location: Generalized Pain Orientation: Right Pain Descriptors / Indicators: Aching;Constant Pain Frequency: Constant Pain Onset: On-going Patients Stated Pain Goal: 3 Pain Intervention(s): Medication (See eMAR)  See Function Navigator for Current Functional Status.   Therapy/Group: Individual Therapy    [name], Juddson Cobern L 01/15/2016, 2:15 PM

## 2016-01-15 NOTE — Progress Notes (Signed)
Physical Therapy Session Note  Patient Details  Name: Matthew SmartJonathan XXXLyons MRN: [id number] Date of Birth: Mar 19, 1991  Today's Date: 01/15/2016 PT Individual Time: [id number] PT Individual Time Calculation (min): 54 min    Skilled Therapeutic Interventions/Progress Updates:    Pt received in w/c, noting 7/10 R shoulder pain & denying pain medication but agreeable to PT. Pt self propelled w/c with BUE x 100 ft room>gym with supervision & multiple rest breaks. Pt required total A for placement of sliding board and to lift thigh to assist with placement of board. Pt able to complete all sliding board transfers with Min A to/from mat table & nustep. Pt played Wii games while sitting on edge of mat table without BUE support and with/without BLE support. Pt required supervision for sitting balance tasks & did not demonstrate any loss of balance. Utilized nu-step level 2 x 12 minutes with BLE supports. Pt occasionally became frustrated during session and reported he did not know how to park w/c next to surface for transfers; educated pt to park w/c at an angle. Pt also frustrated that therapist wanted him to push himself in w/c. Pt's friend present at end of session & pt returned to room in w/c & left with all needs within reach.   Therapy Documentation Precautions:  Precautions Precautions: Fall Precaution Comments: Monitor BP, ACEs, TEDs Restrictions Weight Bearing Restrictions: Yes RUE Weight Bearing: Non weight bearing  Pain: Pain Assessment Pain Assessment: 0-10 Pain Score: 7  Pain Location: Shoulder Pain Orientation: Right Pain Intervention(s):  (pt declined pain medication)    See Function Navigator for Current Functional Status.   Therapy/Group: Individual Therapy  Sandi MariscalVictoria M Jerrine Urschel 01/15/2016, 2:43 PM

## 2016-01-15 NOTE — Progress Notes (Signed)
Occupational Therapy Session Note  Patient Details  Name: Matthew [name] MRN: [id number] Date of Birth: 03-13-1991  Today's Date: 01/15/2016 OT Individual Time: 1000-1100 OT Treatment Time: 60 min     Short Term Goals: Week 2:  OT Short Term Goal 1 (Week 2): Pt will complete LB dressing in circle sit with mod A OT Short Term Goal 2 (Week 2): Pt will position self in circle sit with min A in prep for LB dressing OT Short Term Goal 3 (Week 2): Pt will wash LE with min A OT Short Term Goal 4 (Week 2): Pt will dress UB in w/c position with set-up  OT Short Term Goal 5 (Week 2): [facility]  Skilled Therapeutic Interventions/Progress Updates:   Pt seen for skilled OT session focusing on circle sitting and increasing balance in preparation for ADLs. Pt sitting in w/c upon arrival with friend present, but left before session. Pt agreeable to tx session.  OT donned TEDS and acre wraps total A x 2 for time. Pt self propelled w/c to therapy gym and completed all transfers to and from therapy mat using sliding board and min A. Pt  practiced getting into circle sit position on therapy mat reaching for toes and untieng his shoes initially requiring max A from therapist for balance. Pt progressed to gaining balance and holding position for several seconds using leg loops for support. Initially pt complained of pain in hamstrings in long sitting and pain in hip flexors during circle sitting but with repositioning pt was able to tolerate session and OT educated on the importance of stretching in order to lengthen muscles and decrease pain with stretch. OT recommended having friends/family stretch pt, but also positioning hospital bed with legs flat and sitting up to provide the most stretch to the hamstrings during free time. OT educated on the pros and cons of getting hospital bed for home, but pt is uninterested at this time. Sitting EOM pt practiced leaning to the right and left in  preparation for toileting and dressing. Pt self propelled w/c back to room and left with call bell in reach.   Therapy Documentation Precautions:  Precautions Precautions: Fall Precaution Comments: Monitor BP, ACEs, TEDs Restrictions Weight Bearing Restrictions: Yes RUE Weight Bearing: Non weight bearing  See Function Navigator for Current Functional Status.   Therapy/Group: Individual Therapy  Matthew [name] 01/15/2016, 12:17 PM

## 2016-01-15 NOTE — Progress Notes (Signed)
Occupational Therapy Session Note  Patient Details  Name: Matthew [name] XXXLyons MRN: [id number] Date of Birth: August 25, 1990  Today's Date: 01/15/2016 OT Individual Time: 1447-1531 OT Individual Time Calculation (min): 44 min    Skilled Therapeutic Interventions/Progress Updates:    Pt completed transfer from wheelchair to therapy mat with min assist, after placement of the sliding board.  He was able to maintain static sitting EOM with supervision.  Worked on catching and tossing a beach ball as well as a basketball in sitting.  He was able to catch it and toss it with increased efficiency as long as the ball was thrown toward midline.  Decreased ability to reach beyond base of support and catch the ball.  He relied on his UEs to regain his loss of balance forward after tossing the ball.  Progressed to having him catch the ball then lean back onto therapy tech.  He then had to initiated trunk flexion to come back up to midline to toss the ball.  Increased time to return to midline to toss ball but he was able to complete this with supervision.  Finished session by work on lateral leans and reaching to the right side of the mat to pick up stacking cups and then transition them to the other side, one at a time to place them.  Increased attempts with min guard assist to return back up to midline from side sitting on forearm.  No LOB noted with this task.  Pt transferred back to wheelchair from mat with min assist using sliding board.  Pt left in wheelchair with call button in reach.    Therapy Documentation Precautions:  Precautions Precautions: Fall Precaution Comments: Monitor BP, ACEs, TEDs Restrictions Weight Bearing Restrictions: No RUE Weight Bearing: Non weight bearing  Pain: Pain Assessment Pain Assessment: No/denies pain Pain Score: 6  ADL: See Function Navigator for Current Functional Status.   Therapy/Group: Individual Therapy  Bonita Brindisi OTR/L 01/15/2016, 4:14 PM
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
## 2016-01-16 ENCOUNTER — Inpatient Hospital Stay (HOSPITAL_COMMUNITY): Payer: Medicaid Other | Admitting: Physical Therapy

## 2016-01-16 ENCOUNTER — Inpatient Hospital Stay (HOSPITAL_COMMUNITY): Payer: Medicaid Other | Admitting: Occupational Therapy

## 2016-01-16 ENCOUNTER — Inpatient Hospital Stay (HOSPITAL_COMMUNITY): Payer: Self-pay | Admitting: Occupational Therapy

## 2016-01-16 ENCOUNTER — Inpatient Hospital Stay (HOSPITAL_COMMUNITY): Payer: Medicaid Other

## 2016-01-16 MED ORDER — SORBITOL 70 % SOLN
60.0000 mL | Status: AC
Start: 2016-01-16 — End: 2016-01-16
  Administered 2016-01-16: 60 mL via ORAL
  Filled 2016-01-16: qty 60

## 2016-01-16 MED ORDER — BACLOFEN 10 MG PO TABS
10.0000 mg | ORAL_TABLET | Freq: Two times a day (BID) | ORAL | Status: DC
  Administered 2016-01-16 – 2016-01-18 (×5): 10 mg via ORAL
  Filled 2016-01-16 (×6): qty 1
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
## 2016-01-16 NOTE — Progress Notes (Signed)
Physical Therapy Session Note  Patient Details  Name: Matthew [name] MRN: [id number] Date of Birth: 12-03-1990  Today's Date: 01/16/2016 PT Individual Time: 1300-1400 PT Individual Time Calculation (min): 60 min   Short Term Goals: Week 2:  PT Short Term Goal 1 (Week 2): Pt will be able to progress transfers to overall min assist uneven surfaces PT Short Term Goal 2 (Week 2): Pt will be able to propel w/c x 150' with supervision to increase functional mobility and UE strength PT Short Term Goal 3 (Week 2): w/c evaluation will be initiated to prepare for discharge PT Short Term Goal 4 (Week 2): Pt will be able to initiate use of leg loops to manage BLE during mobility  Skilled Therapeutic Interventions/Progress Updates:    Session focused on functional transfers with slideboard with emphasis on pt managing w/c parts and attempting to manage slideboard with min to mod assist and extra time including [facility] from w/c, neuro re-ed for sitting balance/trunk control and BLE motor activation while shooting basketball and kicking ball with supervision to steadying assist for balance, and w/c propulsion for overall endurance. When discussing d/c, pt expressed that he thought he would be going to a rehab place from here but did not elaborate on why just stating he wouldn't be able to go home right away. End of session [facility] and left with NT.  Therapy Documentation Precautions:  Precautions Precautions: Fall Precaution Comments: Monitor BP, ACEs, TEDs Restrictions  Pain: Premedicated for shoulder pain.    See Function Navigator for Current Functional Status.   Therapy/Group: Individual Therapy  Karolee StampsGray, Evamaria Detore Darrol PokeBrescia  Robbi Spells B. Burlin Mcnair, PT, DPT  01/16/2016, 2:13 PM

## 2016-01-16 NOTE — Progress Notes (Signed)
Occupational Therapy Session Note  Patient Details  Name: Matthew [name] MRN: [id number] Date of Birth: Feb 01, 1991  Today's Date: 01/16/2016 OT Individual Time: [id number] OT Individual Time Calculation (min): 60 min    Short Term Goals: Week 2:  OT Short Term Goal 1 (Week 2): Pt will complete LB dressing in circle sit with mod A OT Short Term Goal 2 (Week 2): Pt will position self in circle sit with min A in prep for LB dressing OT Short Term Goal 3 (Week 2): Pt will wash LE with min A OT Short Term Goal 4 (Week 2): Pt will dress UB in w/c position with set-up  OT Short Term Goal 5 (Week 2): [facility]  Skilled Therapeutic Interventions/Progress Updates:    Pt seen for OT session focusing on functional transfers and ADL re-training. Pt in supine upon arrival, agreeable to tx session. He refused bathing/dressing this morning, reviewed OT goals and need to complete ADL tasks in order to increase independence with tasks, he cont to refuse. He did appear more willing to participate when given options of what to work on in therapy. He chose to work on toilet transfers this sessions. He voiced need to urinate, able to manage urine and clothing with set-up of items. ACE wraps, TED hose, leg loops and socks donned total A. He transferred to EOB with min A using leg loops and hospital bed functions. He completed sliding board transfer to/ from  drop arm BSC with min A and assist to place board. Using leg loops, he was able to manage LEs during transfer to toilet, however, required assist to manage LEs during return to w/c. He refused to complete full simulated toileting task, not wanting to remove pants. With encouragement and VCs for technique, pt completed lateral leans into bed and into w/c on R/ L and demonstrated ability to reach to manage clothing with steadying assist.  He completed grooming tasks with set-up to mod I from w/c level at sink. Hamstring and calf  stretches performed from w/c level per pt request due to tightness in B LEs affecting functional positioning needed for LB dressing/bathing tasks. Pt left in w/c at end of session, set-up with breakfast tray and all needs in reach.  During bed mobility, noted pt with increased tone and spasms in B LEs, RN made aware and medication administered. Pt very upset about the number of pills he is required to take each day, Educated in very general terms regarding types of medication (long acting vs short acting pain meds and meds of tone/ spasms).   Therapy Documentation Precautions:  Precautions Precautions: Fall Precaution Comments: Monitor BP, ACEs, TEDs Restrictions Weight Bearing Restrictions: Yes RUE Weight Bearing: Non weight bearing  See Function Navigator for Current Functional Status.   Therapy/Group: Individual Therapy  [name], Johnmichael Melhorn C 01/16/2016, 7:11 AM
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
## 2016-01-16 NOTE — Progress Notes (Signed)
Physical Therapy Session Note  Patient Details  Name: Matthew SmartJonathan XXXLyons MRN: [id number] Date of Birth: May 23, 1991  Today's Date: 01/16/2016 PT Individual Time: 1000-1055 PT Individual Time Calculation (min): 55 min   Short Term Goals: Week 2:  PT Short Term Goal 1 (Week 2): Pt will be able to progress transfers to overall min assist uneven surfaces PT Short Term Goal 2 (Week 2): Pt will be able to propel w/c x 150' with supervision to increase functional mobility and UE strength PT Short Term Goal 3 (Week 2): w/c evaluation will be initiated to prepare for discharge PT Short Term Goal 4 (Week 2): Pt will be able to initiate use of leg loops to manage BLE during mobility  Skilled Therapeutic Interventions/Progress Updates:    Pt received seated in w/c, c/o pain as below and agreeable to treatment with encouragement. W/c propulsion x200' and x75' during session with BUE and S; slow speed and occasional rest breaks due to UE strength deficits. Transfer w/c <> bed with min guard; requires assist for placement of board then standbyA and blocking at B knees during lateral scoot. Sit >supine with leg loops and minA to A with LLE into bed. Min cueing/facilitation at hips/shoulder for scooting in bed. Supine>sit with S and increased time, use of leg loops to A with BLEs off EOB. StandbyA for seated balance with occasional LOBs in various directions, able to recover with use of BUEs. Increased time required for all activities due to poor initiation, decreased motivation and c/o fatigue. Standing frame x9 min; 3 sets 10 reps glute sets. BP in standing 103/63 and pt asymptomatic throughout. Returned to room totalA for energy conservation as pt had OT session immediately after. Remained seated in w/c, all needs in reach at completion of session.   Therapy Documentation Precautions:  Precautions Precautions: Fall Precaution Comments: Monitor BP, ACEs, TEDs Restrictions Weight Bearing Restrictions: Yes RUE  Weight Bearing: Non weight bearing Pain: Pain Assessment Pain Assessment: 0-10 Pain Score: 7  Faces Pain Scale: Hurts a little bit Pain Type: Acute pain Pain Location: Arm Pain Orientation: Right Pain Descriptors / Indicators: Aching Pain Onset: On-going Patients Stated Pain Goal: 4 Pain Intervention(s): Repositioned Multiple Pain Sites: No   See Function Navigator for Current Functional Status.   Therapy/Group: Individual Therapy  [name] 01/16/2016, 11:08 AM

## 2016-01-17 ENCOUNTER — Inpatient Hospital Stay (HOSPITAL_COMMUNITY): Payer: Medicaid Other

## 2016-01-17 ENCOUNTER — Inpatient Hospital Stay (HOSPITAL_COMMUNITY): Payer: Medicaid Other | Admitting: *Deleted

## 2016-01-17 ENCOUNTER — Inpatient Hospital Stay (HOSPITAL_COMMUNITY): Payer: Self-pay | Admitting: Occupational Therapy

## 2016-01-17 ENCOUNTER — Inpatient Hospital Stay (HOSPITAL_COMMUNITY): Payer: Medicaid Other | Admitting: Occupational Therapy

## 2016-01-17 ENCOUNTER — Inpatient Hospital Stay (HOSPITAL_COMMUNITY): Payer: Medicaid Other | Admitting: Physical Therapy

## 2016-01-17 NOTE — Progress Notes (Signed)
Occupational Therapy Weekly Progress Note  Patient Details  Name: Matthew [name] MRN: [id number] Date of Birth: 04-11-1991  Beginning of progress report period: January 10, 2016 End of progress report period: January 17, 2016  Today's Date: 01/17/2016 OT Individual Time: 0945-1100 OT Individual Time Calculation (min): 75 min    Patient has met 3 of 5 short term goals. Pt cont refuses bathing/dressing during OT sessions. Have been working on simulated bathing/dressing tasks in therapy gym with pt getting into circle sitting position on mat.   Pt cont to make steady progress towards OT goals, however, cont with limited engagement during therapy sessions. Change of d/c disposition to SNF as family unable to provide needed assistance at d/c. OT goals remain appropriate, cont POC.  Patient continues to demonstrate the following deficits: abnormal posture, acute pain, muscle weakness (generalized), pain in thoracic spine and paraparesis at level T-1 and therefore will continue to benefit from skilled OT intervention to enhance overall performance with BADL and Reduce care partner burden.  Patient progressing toward long term goals..  Continue plan of care.  OT Short Term Goals Week 2:  OT Short Term Goal 1 (Week 2): Pt will complete LB dressing in circle sit with mod A OT Short Term Goal 1 - Progress (Week 2): Not met OT Short Term Goal 2 (Week 2): Pt will position self in circle sit with min A in prep for LB dressing OT Short Term Goal 2 - Progress (Week 2): Met OT Short Term Goal 3 (Week 2): Pt will wash LE with min A OT Short Term Goal 3 - Progress (Week 2): Not met OT Short Term Goal 4 (Week 2): Pt will dress UB in w/c position with set-up  OT Short Term Goal 4 - Progress (Week 2): Met OT Short Term Goal 5 (Week 2): Pt will transfer to [facility] with max A +1 OT Short Term Goal 5 - Progress (Week 2): Met Week 3:  OT Short Term Goal 1 (Week 3): Pt will complete 3/3 toileting steps in real or  simulated toileting task with min A OT Short Term Goal 2 (Week 3): Pt will complete sliding board transfer w/c > therapy mat with set-up/ supervision to increase independence with functional tasks OT Short Term Goal 3 (Week 3): Pt will donn/ doff B socks with min A in circle sit position in prep for full  LB bathing/dressing task  Skilled Therapeutic Interventions/Progress Updates:    Pt seen for OT session focusing on ADL re-training and UE strengthening and active tolerance. Pt asleep in supine upon arrival, easily awoken and agreeable to tx session. Once again pt refused LB  Bathing/dressing in supine. Despite encouragement adn review of OT goals, pt cont to refuse. TED hose, ACE wraps, leg loops, and socks donned total A. Pt with increased tone and spasms in B LEs during bed mobility. RN made aware and muscle relaxer administered. He transferred to EOB with min A for assisting R LE off EOB. Min A sliding board transfer completed to w/c with assist to place board, stabilize equipment, and VCs for management of LEs. He completed UB bathing/dressing at sink, reaching forward to obtain items, demonstrating improved core strength and stability. He self propelled w/c to therapy gym and completed 5 min on UE SCI FIT for UE strengthening and cardio respiratory endurance. He returned to room, left set-up with meal tray and all needs in reach.   Therapy Documentation Precautions:  Precautions Precautions: Fall Precaution Comments: Monitor BP, ACEs, TEDs  Restrictions Weight Bearing Restrictions: No RUE Weight Bearing: Non weight bearing Pain: Pain Assessment Pain Assessment: No/denies pain (premedicated prior to therapy)  See Function Navigator for Current Functional Status.   Therapy/Group: Individual Therapy  [name], Antavion Bartoszek C 01/17/2016, 7:17 AM
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
## 2016-01-17 NOTE — Progress Notes (Signed)
Physical Therapy Session Note  Patient Details  Name: Matthew [name] MRN: [id number] Date of Birth: [birth year]  Today's Date: 01/17/2016 PT Individual Time: 7371-0626 PT Individual Time Calculation (min): 30 min   Short Term Goals: Week 2:  PT Short Term Goal 1 (Week 2): Pt will be able to progress transfers to overall min assist uneven surfaces PT Short Term Goal 2 (Week 2): Pt will be able to propel w/c x 150' with supervision to increase functional mobility and UE strength PT Short Term Goal 3 (Week 2): w/c evaluation will be initiated to prepare for discharge PT Short Term Goal 4 (Week 2): Pt will be able to initiate use of leg loops to manage BLE during mobility  Skilled Therapeutic Interventions/Progress Updates:    Pt presented in w/c agreeable to therapy.  Self propelled in chair approx 138f with x1 break 2/2 fatigue.  Pt performed slideboard transfer with minA from PTA for elevating LLE for slideboard placement and  verbal cues for use of leg straps to assist with LE placement. Pt able to transfer onto mat with minA from PTA for LE placement.  Pt then performed UE therex with mirror in front of pt for feedback for improving posture for level shoulders and attempting cx neutral spine with PTA providing occasional tactile/verbal cues.  Pt able to perform seated bicep curls triceps extension, alternating shoulder flexion, and shoulder horizontal abduction with 1.5lb wts x 10 bilaterally.  Pt also attempted x 2 push offs from mat with use of hand rails to attempt wt bearing through BLE.  Pt performed SB transfer to w/c with minA and returned to room with all current needs met.   Therapy Documentation Precautions:  Precautions Precautions: Fall Precaution Comments: Monitor BP, ACEs, TEDs Restrictions Weight Bearing Restrictions: No RUE Weight Bearing: Non weight bearing General:   Vital Signs:  Pain: Pain Assessment Pain Assessment: 0-10 Pain Score: 5  Pain Type: Acute  pain Pain Location: Arm Pain Orientation: Right Pain Descriptors / Indicators: Aching Pain Frequency: Constant Pain Onset: On-going Patients Stated Pain Goal: 4 Pain Intervention(s): Medication (See eMAR) Mobility:   Locomotion :    Trunk/Postural Assessment :    Balance:   Exercises:   Other Treatments:     See Function Navigator for Current Functional Status.   Therapy/Group: Individual Therapy  Latarra Eagleton  Leydi Winstead, PTA  01/17/2016, 3:34 PM
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
## 2016-01-17 NOTE — Progress Notes (Signed)
Pt refusing dressing change. He said they have already been changed today. Educated and still refused. Royston CowperIsley, Cena Bruhn E, RN

## 2016-01-17 NOTE — Progress Notes (Signed)
Physical Therapy Session Note  Patient Details  Name: Matthew [name] MRN: [id number] Date of Birth: 1990-10-09  Today's Date: 01/17/2016 PT Individual Time: 1640-1705 PT Individual Time Calculation (min): 25 min   Short Term Goals: Week 2:  PT Short Term Goal 1 (Week 2): Pt will be able to progress transfers to overall min assist uneven surfaces PT Short Term Goal 2 (Week 2): Pt will be able to propel w/c x 150' with supervision to increase functional mobility and UE strength PT Short Term Goal 3 (Week 2): w/c evaluation will be initiated to prepare for discharge PT Short Term Goal 4 (Week 2): Pt will be able to initiate use of leg loops to manage BLE during mobility Week 3:     Skilled Therapeutic Interventions/Progress Updates:    [facility] of pain and agreeable to PT. PT instructed [facility] in [facility] mobility with supervision A for 18150ft x2 with min cues for decreased turning radius in confined area.   PT instructed patient in SB transfer x 2 with mod A from PT with assist to position board and to position BLE to allow improved WB through LE.  Press ups with mod A to block BLE x 5 and 10 second hold. Mod cues for improved head hips relationship to increase WB through BLE and decrease weight through BLE.   Patient returned to room and left [facility] with call bell within reach.   Therapy Documentation Precautions:  Precautions Precautions: Fall Precaution Comments: Monitor BP, ACEs, TEDs Restrictions Weight Bearing Restrictions: No RUE Weight Bearing: Non weight bearing General:   Vital Signs: Therapy Vitals Temp: 98.6 F (37 C) Temp Source: Oral Pulse Rate: 65 Resp: 18 BP: 113/73 mmHg Patient Position (if appropriate): Sitting Oxygen Therapy SpO2: 100 % O2 Device: Not Delivered Pain: Pain Assessment Pain Assessment: 0-10 Pain Score: 7  Faces Pain Scale: Hurts little more Pain Type: Acute pain Pain Location: Shoulder Pain  Orientation: Right Pain Descriptors / Indicators: Aching Pain Onset: On-going Pain Intervention(s): Medication (See eMAR)  See Function Navigator for Current Functional Status.   Therapy/Group: Individual Therapy  Golden Popustin E Breon Diss 01/17/2016, 6:38 PM

## 2016-01-17 NOTE — Progress Notes (Signed)
Occupational Therapy Session Note  Patient Details  Name: Matthew SmartJonathan XXXLyons MRN: [id number] Date of Birth: 06/04/1991  Today's Date: 01/17/2016 OT Individual Time: 1300-1357 OT Individual Time Calculation (min): 57 min    Skilled Therapeutic Interventions/Progress Updates:    Pt completed wheelchair mobility to the gym with supervision.  Still with increased time needed to complete turns in all directions.  Utilized sliding board in the therapy gym for transfer to the mat with min assist after placement, on level surface.  Pt positioned in supine with mod assist for lifting LEs.  Completed stretching to bilateral glutes and hamstrings.  Pt with increased muscle spasms and pain with stretching of glutes with knee extended.  Transitioned to sitting with min assist and pt using leg loops to help transition LEs off of the EOM.  Once in sitting had pt work on Wii activity using BUEs simultaneously while maintaining balance.  Pt needing only min guard assist most of the time with one LOB to the left, which he was able to use his UE to regain balance.  Pt transferred back to the wheelchair after sitting EOM and working for 20 mins with rest breaks every 3-4 mins.  Pt transporting back to the room with therapist assist.  Pt left in wheelchair with call button and phone in reach.    Therapy Documentation Precautions:  Precautions Precautions: Fall Precaution Comments: Monitor BP, ACEs, TEDs Restrictions Weight Bearing Restrictions: No RUE Weight Bearing: Non weight bearing  Pain: Pain Assessment Pain Assessment: Faces Pain Score: 5  Faces Pain Scale: Hurts little more Pain Type: Neuropathic pain Pain Location: Leg Pain Orientation: Right;Left Pain Descriptors / Indicators: Grimacing Pain Onset: With Activity (during stretching) Pain Intervention(s): Repositioned;Emotional support ADL: See Function Navigator for Current Functional Status.   Therapy/Group: Individual  Therapy  Shawnice Tilmon OTR/L 01/17/2016, 4:53 PM

## 2016-01-17 NOTE — Progress Notes (Signed)
Social Work Patient ID: Matthew [name], male   DOB: 1991-06-22, 25 y.o.   MRN: [id number]  Have reviewed team conference with pt and mother.  Both with general understanding of current functional levels.  Pt confirms he has spoken with his mother about her concerns of being able to meet his care needs and aware she feels SNF is only option at this time.  He states that he doesn't want to go to SNF but is aware and agreeable to this being the plan at this point.  Encouraged him to continue working with therapies which he states he will do. Beginning SNF bed search.  Vennesa Bastedo, Matthew [name]

## 2016-01-17 NOTE — Progress Notes (Signed)
Physical Therapy Note  Patient Details  Name: Matthew [name] XXXLyons MRN: [id number] Date of Birth: 01-15-1991 Today's Date: 01/17/2016  1520-1605, 45 min individual tx Pain : L shoulder 6/10; premedicated  Matthew [name] requested getting back in bed due to fatigue.  Matthew [name] explained schedule to Matthew [name], and he was willing to participate.  Matthew [name] requested using static stander.  1 VC for w/c parts management and use of leg loops for LEs mgt.  BP in sitting 116/77, HR 68 in sitting.  Matthew [name] tolerated standing x 30 minutes while wt shifting hips L><R using mirror for feedback; BP in standing 114/69, HR 92.  Matthew [name] left resting in w/c with all needs within reach.  Yehoshua Vitelli 01/17/2016, 3:55 PM

## 2016-01-18 ENCOUNTER — Inpatient Hospital Stay (HOSPITAL_COMMUNITY): Payer: Self-pay | Admitting: Occupational Therapy

## 2016-01-18 ENCOUNTER — Inpatient Hospital Stay (HOSPITAL_COMMUNITY): Payer: Medicaid Other

## 2016-01-18 MED ORDER — OXYCODONE HCL 5 MG PO TABS
5.0000 mg | ORAL_TABLET | ORAL | Status: DC | PRN
  Administered 2016-01-28: 10 mg via ORAL
  Filled 2016-01-18: qty 1
  Filled 2016-01-18: qty 2

## 2016-01-18 MED ORDER — TRAMADOL HCL 50 MG PO TABS: 50.0000 mg | ORAL_TABLET | Freq: Four times a day (QID) | ORAL | Status: DC

## 2016-01-18 MED ORDER — BACLOFEN 10 MG PO TABS
10.0000 mg | ORAL_TABLET | Freq: Three times a day (TID) | ORAL | Status: DC
  Administered 2016-01-18 – 2016-01-23 (×15): 10 mg via ORAL
  Filled 2016-01-18 (×16): qty 1

## 2016-01-18 MED ORDER — TRAMADOL HCL 50 MG PO TABS
50.0000 mg | ORAL_TABLET | Freq: Four times a day (QID) | ORAL | Status: DC
  Administered 2016-01-18 – 2016-01-19 (×2): 100 mg via ORAL
  Administered 2016-01-19 – 2016-01-20 (×2): 50 mg via ORAL
  Administered 2016-01-20: 100 mg via ORAL
  Administered 2016-01-20: 50 mg via ORAL
  Administered 2016-01-21: 100 mg via ORAL
  Administered 2016-01-21 – 2016-01-22 (×2): 50 mg via ORAL
  Administered 2016-01-22 – 2016-01-23 (×3): 100 mg via ORAL
  Administered 2016-01-23: 50 mg via ORAL
  Administered 2016-01-23 (×2): 100 mg via ORAL
  Administered 2016-01-24: 50 mg via ORAL
  Administered 2016-01-24: 100 mg via ORAL
  Administered 2016-01-24 – 2016-01-25 (×2): 50 mg via ORAL
  Administered 2016-01-25: 100 mg via ORAL
  Administered 2016-01-25: 50 mg via ORAL
  Administered 2016-01-26: 100 mg via ORAL
  Administered 2016-01-26 (×2): 50 mg via ORAL
  Administered 2016-01-26 – 2016-01-28 (×7): 100 mg via ORAL
  Administered 2016-01-29 – 2016-01-30 (×3): 50 mg via ORAL
  Filled 2016-01-18 (×2): qty 2
  Filled 2016-01-18: qty 1
  Filled 2016-01-18 (×2): qty 2
  Filled 2016-01-18: qty 1
  Filled 2016-01-18: qty 2
  Filled 2016-01-18: qty 1
  Filled 2016-01-18: qty 2
  Filled 2016-01-18: qty 1
  Filled 2016-01-18 (×9): qty 2
  Filled 2016-01-18 (×3): qty 1
  Filled 2016-01-18 (×2): qty 2
  Filled 2016-01-18 (×2): qty 1
  Filled 2016-01-18 (×2): qty 2
  Filled 2016-01-18: qty 1
  Filled 2016-01-18: qty 2
  Filled 2016-01-18: qty 1
  Filled 2016-01-18: qty 2
  Filled 2016-01-18: qty 1
  Filled 2016-01-18 (×3): qty 2
  Filled 2016-01-18 (×6): qty 1
  Filled 2016-01-18 (×2): qty 2

## 2016-01-18 MED ORDER — OXYCODONE HCL ER 10 MG PO T12A
10.0000 mg | EXTENDED_RELEASE_TABLET | Freq: Two times a day (BID) | ORAL | Status: DC
  Administered 2016-01-18 – 2016-01-21 (×7): 10 mg via ORAL
  Filled 2016-01-18 (×8): qty 1

## 2016-01-18 MED ORDER — OXYCODONE HCL 5 MG PO TABS: 5.0000 mg | ORAL_TABLET | ORAL | Status: DC | PRN
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
## 2016-01-18 NOTE — Progress Notes (Signed)
Occupational Therapy Session Notes  Patient Details  Name: Matthew [name] MRN: [id number] Date of Birth: October 08, 1990  Today's Date: 01/18/2016  Short Term Goals: Week 1:  OT Short Term Goal 1 (Week 1): Pt will complete BSC transfer with max A +1 with +2 for safety if needed OT Short Term Goal 1 - Progress (Week 1): Met OT Short Term Goal 2 (Week 1): Pt will dress UB with set-up assist.  OT Short Term Goal 2 - Progress (Week 1): Not met OT Short Term Goal 3 (Week 1): Pt will tolerate 3 consecutive hours up in chair in order to increase functional activity tolerance OT Short Term Goal 3 - Progress (Week 1): Met OT Short Term Goal 4 (Week 1): Pt will don pants with max A OT Short Term Goal 4 - Progress (Week 1): Not met\  Week 2:  OT Short Term Goal 1 (Week 2): Pt will complete LB dressing in circle sit with mod A OT Short Term Goal 1 - Progress (Week 2): Not met OT Short Term Goal 2 (Week 2): Pt will position self in circle sit with min A in prep for LB dressing OT Short Term Goal 2 - Progress (Week 2): Met OT Short Term Goal 3 (Week 2): Pt will wash LE with min A OT Short Term Goal 3 - Progress (Week 2): Not met OT Short Term Goal 4 (Week 2): Pt will dress UB in w/c position with set-up  OT Short Term Goal 4 - Progress (Week 2): Met OT Short Term Goal 5 (Week 2): Pt will transfer to [facility] with max A +1 OT Short Term Goal 5 - Progress (Week 2): Met   Week 3:  OT Short Term Goal 1 (Week 3): Pt will complete 3/3 toileting steps in real or simulated toileting task with min A OT Short Term Goal 2 (Week 3): Pt will complete sliding board transfer w/c > therapy mat with set-up/ supervision to increase independence with functional tasks OT Short Term Goal 3 (Week 3): Pt will donn/ doff B socks with min A in circle sit position in prep for full  LB bathing/dressing task  Skilled Therapeutic Interventions/Progress Updates:   Session #1 0905-1005 - 60 Minutes Individual Therapy Pt with  complaints of pain in right arm (5/10), monitored during session Upon entering room, pt found supine in bed on cell phone. Pt with minimal conversation and mainly paying attention to cell phone. Therapist encouraged pt to participate in session. Therapist assisted with donning of lotion to bilateral feet due to increased dryness. Therapist then donned bilateral TEDs. Pt directed therapist to don ace wraps to BLEs up to knees, therapist did so. Pt then directed therapist to don bilateral leg loops. From here, pt engaged in bed mobility using leg loops with min assist, some assistance needed for management of LEs. Pt sat EOB, then transferred EOB to w/c using slide board. During transfer, pt worked on directing care and completing w/c management as best he could. Pt donned left leg rest, therapist assisting with right. Pt then propelled self to sink for grooming task of washing face and brushing teeth. Pt then requested that therapist stretch his BLEs, therapist performed knee flexion/extension, hip flexion, external, internal rotation stretches. Pt takes increased time and is working hard on directing his own care. At end of session, left pt seated in w/c with breakfast tray and all needs within reach, pt refused quick release belt (he states he hasn't been wearing it).   Session #  2 1500-1600 - 60 Minutes Pt with "same pain as before" - right shoulder Individual Therapy Upon entering room, pt found seated in w/c, sleepy but willing to work with therapist. Pt propelled self to therapy gym. Pt transferred w/c to edge of mat to supine with min assist for slide board transfer and mod assist for sit to supine. Pt laid in supine and therapist performed LB stretching per his request. Taught pt some self stretching exercises in supine for him to complete on his own. Pt then rolled into prone and pt performed quad and hip stretches this way. Pt happy to be in prone position as he states he normally sleeps like this and  it was a comfortable position for him. Pt rolled onto back after stretches, then to left side and sat edge of mat with min guard assist. Pt transferred back to w/c and requested that therapist assist him back to room due to him being too tired. Pt transferred w/c to edge of bed to supine and therapist left pt supine in bed with all needs within reach.   Therapy Documentation Precautions:  Precautions Precautions: Fall Precaution Comments: Monitor BP, ACEs, TEDs Restrictions Weight Bearing Restrictions: No RUE Weight Bearing: Non weight bearing  Vital Signs: Therapy Vitals Temp: 98.3 F (36.8 C) Temp Source: Oral Resp: 18 Oxygen Therapy SpO2: 100 % O2 Device: Not Delivered  See Function Navigator for Current Functional Status.  Therapy/Group: Individual Therapy  Chrys Racer , MS, OTR/L, CLT  01/18/2016, 7:56 AM

## 2016-01-18 NOTE — Plan of Care (Signed)
Problem: SCI BOWEL ELIMINATION Goal: RH STG MANAGE BOWEL WITH ASSISTANCE STG Manage Bowel with total Assistance.  Outcome: Not Progressing Refusing bowel program. Requires sorbitol for BM  Goal: RH STG SCI MANAGE BOWEL PROGRAM W/ASSIST OR AS APPROPRIATE STG SCI Manage bowel program w/min assist or as appropriate.  Outcome: Not Progressing refusing  Problem: RH SKIN INTEGRITY Goal: RH STG ABLE TO PERFORM INCISION/WOUND CARE W/ASSISTANCE STG Able To Perform Incision/Wound Care With total Assistance.  Outcome: Progressing Pt only allowing dressings to be changed once a day

## 2016-01-18 NOTE — Progress Notes (Signed)
Patient expressing concerns regarding all the pain medications prescribed.  I discussed rational for each medication. Oxycontin decreased to 10 mg bid and will monitor / taper further as tolerated.

## 2016-01-18 NOTE — Progress Notes (Signed)
Pt refusing night bath. Pt complaining of taking too much medication, but when RN says he has the right to refuse, pt says "no, just give them to me." pt voided 350ml. Will continue to monitor. Matthew [name], Matthew Bartnik E, RN

## 2016-01-18 NOTE — Progress Notes (Signed)
Occupational Therapy Session Note  Patient Details  Name: Matthew [name] MRN: [id number] Date of Birth: [birth year]  Today's Date: 01/18/2016 OT Individual Time: 1045-1200 OT Individual Time Calculation (min): 75 min    Short Term Goals: Week 3:  OT Short Term Goal 1 (Week 3): Pt will complete 3/3 toileting steps in real or simulated toileting task with min A OT Short Term Goal 2 (Week 3): Pt will complete sliding board transfer w/c > therapy mat with set-up/ supervision to increase independence with functional tasks OT Short Term Goal 3 (Week 3): Pt will donn/ doff B socks with min A in circle sit position in prep for full  LB bathing/dressing task  Skilled Therapeutic Interventions/Progress Updates:    Pt seen for OT session focusing on circle sitting, sitting balance, and LE control. Pt sitting up in w/c upon arrival, agreeable to tx session. Gave pt option of what to work on in therapy given 3 choices. He chose to work on circle sitting on mat in therapy gym. He self propelled w/c to gym with increased time.  Min A sliding board transfer completed to mat with assist for management of LEs and to place sliding board.  In circle sitting position, worked to [employer] and doff B socks using leg loops to assist with management of LEs and to assist with maintaining sitting balance.  Following extended supported rest break, pt completed balance task sitting in circle sit position and tossed ball. He required breaks to regain balance following each throw and rest breaks throughout. Pt  Then voiced desire to "kick soccer ball" as he had done in PT session. Sitting on elevated EOM, pt  Kicked ball back and forth with therapist demonstrating controled movements in B LEs, R > L.  Min A sliding board transfer completed back to w/c.  Taken back to room total A. Left in room with all needs in reach.   Therapy Documentation Precautions:  Precautions Precautions: Fall Precaution Comments: Monitor BP, ACEs,  TEDs Restrictions Weight Bearing Restrictions: No RUE Weight Bearing: Non weight bearing Pain:   No/ denies pain  See Function Navigator for Current Functional Status.   Therapy/Group: Individual Therapy  [name], Zionah Criswell C 01/18/2016, 7:09 AM

## 2016-01-18 NOTE — Plan of Care (Signed)
Problem: SCI BOWEL ELIMINATION Goal: RH STG MANAGE BOWEL WITH ASSISTANCE STG Manage Bowel with total Assistance.  Outcome: Not Progressing Refuses bowel prep but responds to sorbitol; LBM 6/13  Problem: SCI BLADDER ELIMINATION Goal: RH STG MANAGE BLADDER WITH ASSISTANCE STG Manage Bladder With total Assistance  Outcome: Not Progressing Refuses cath; uses urinal

## 2016-01-18 NOTE — Progress Notes (Signed)
Physical Therapy Weekly Progress Note  Patient Details  Name: Matthew [name] MRN: [id number] Date of Birth: 1990/09/24  Beginning of progress report period: January 12, 2016 End of progress report period: January 18, 2016  Today's Date: 01/18/2016 PT Individual Time: 1336-1430 PT Individual Time Calculation (min): 54 min  Session started a few min late due to pt on personal phone call. Pt self propelled w/c down to therapy gym with extra time but no cues. Functional transfers with min assist using slideboard with PT assisting with placement and removal and encouraged ment for patient to attempt to reposition LE's with leg loops during the transfer. Neuro re-ed to address sitting balance EOM and then using standing frame x 20' while playing Wii games to address trunk control, balance, weightbearing through BLE and overall standing tolerance. Pt with L lateral shift noted. End of session returned back to room with all needs in reach.   Patient has met 3 of 4 short term goals.  One short term goal not met due to plan changed to SNF and specialty w/c evaluation will be deferred to next venue of care. Pt making good functional progress with mobility. Min assist overall for slideboard transfers and bed mobility. D/c plan changed to SNF due to family unable to provide level of care anticipated.  Patient continues to demonstrate the following deficits: paraparesis, decreased endurance, decreased strength, pain, decreased balance, impaired sensation, decreased ROM, and therefore will continue to benefit from skilled PT intervention to enhance overall performance with activity tolerance, balance, postural control, ability to compensate for deficits, functional use of  right lower extremity and left lower extremity and coordination.  Patient progressing toward long term goals..  Continue plan of care.  PT Short Term Goals Week 2:  PT Short Term Goal 1 (Week 2): Pt will be able to progress transfers to overall  min assist uneven surfaces PT Short Term Goal 1 - Progress (Week 2): Met PT Short Term Goal 2 (Week 2): Pt will be able to propel w/c x 150' with supervision to increase functional mobility and UE strength PT Short Term Goal 2 - Progress (Week 2): Met PT Short Term Goal 3 (Week 2): w/c evaluation will be initiated to prepare for discharge PT Short Term Goal 3 - Progress (Week 2): Discontinued (comment) (Pt now to D/C to SNF so no longer applicable) PT Short Term Goal 4 (Week 2): Pt will be able to initiate use of leg loops to manage BLE during mobility PT Short Term Goal 4 - Progress (Week 2): Met Week 3:  PT Short Term Goal 1 (Week 3): = LTGs overall supervision/mod I w/c level due to d/c plan now SNF  Skilled Therapeutic Interventions/Progress Updates:  Financial risk analyst;Neuromuscular re-education;Psychosocial support;UE/LE Strength taining/ROM;Wheelchair propulsion/positioning;UE/LE Coordination activities;Therapeutic Activities;Functional electrical stimulation;Discharge planning;Balance/vestibular training;Functional mobility training;Patient/family education;Splinting/orthotics;Therapeutic Exercise;Pain management;Disease management/prevention;Skin care/wound management   Therapy Documentation Precautions:  Precautions Precautions: Fall Precaution Comments: Monitor BP, ACEs, TEDs   Pain:  discomfort in RUE at times but able to manage with rest breaks.  See Function Navigator for Current Functional Status.  Therapy/Group: Individual Therapy  Canary Brim Ivory Broad, PT, DPT  01/18/2016, 3:43 PM

## 2016-01-18 NOTE — Plan of Care (Signed)
Problem: RH Wheelchair Mobility Goal: LTG Patient will propel w/c in community environment (PT) LTG: Patient will propel wheelchair in community environment, # of feet with assist (PT)  D/c due to SNF

## 2016-01-19 ENCOUNTER — Inpatient Hospital Stay (HOSPITAL_COMMUNITY): Payer: Medicaid Other | Admitting: *Deleted

## 2016-01-19 ENCOUNTER — Inpatient Hospital Stay (HOSPITAL_COMMUNITY): Payer: Self-pay | Admitting: Occupational Therapy

## 2016-01-19 ENCOUNTER — Inpatient Hospital Stay (HOSPITAL_COMMUNITY): Payer: Medicaid Other | Admitting: Occupational Therapy

## 2016-01-19 ENCOUNTER — Inpatient Hospital Stay (HOSPITAL_COMMUNITY): Payer: Medicaid Other

## 2016-01-19 MED ORDER — SENNOSIDES-DOCUSATE SODIUM 8.6-50 MG PO TABS
2.0000 | ORAL_TABLET | Freq: Every day | ORAL | Status: DC
  Administered 2016-01-19 – 2016-01-29 (×10): 2 via ORAL
  Filled 2016-01-19 (×11): qty 2

## 2016-01-19 MED ORDER — SORBITOL 70 % SOLN
45.0000 mL | Freq: Once | Status: DC
  Filled 2016-01-19 (×2): qty 60

## 2016-01-19 NOTE — Progress Notes (Signed)
Occupational Therapy Session Note  Patient Details  Name: Matthew [name] MRN: [id number] Date of Birth: February 18, 1991  Today's Date: 01/19/2016 OT Individual Time: 0945-1100 OT Individual Time Calculation (min): 75 min    Short Term Goals: Week 3:  OT Short Term Goal 1 (Week 3): Pt will complete 3/3 toileting steps in real or simulated toileting task with min A OT Short Term Goal 2 (Week 3): Pt will complete sliding board transfer w/c > therapy mat with set-up/ supervision to increase independence with functional tasks OT Short Term Goal 3 (Week 3): Pt will donn/ doff B socks with min A in circle sit position in prep for full  LB bathing/dressing task  Skilled Therapeutic Interventions/Progress Updates:    Pt seen for OT ADL bathing/dressing session. Pt in supine upon arrival, with increased time and encouragement participated in therapy. Agreeable to bathing/dressing today. Cont education of circle sit position for bathing tasks. Due to LE tone and spasms this morning pt with difficulty managing LEs. Stretching to hip flexors and hamstrings performed to B sides to assist with breaking up tone. Educated regarding importance of stretching and functional implications.  Upon set-up for bathing pt stated he "could already do that stuff" and did not require assist from therapist. Questioned by therapist how he would reach to wash LEs and bottom from supine position. Pt declined answering and cont to bathe as able. OT assist with set-up and managing LEs when asked by pt. Will cont to try to educate regarding positioning and technique in order to allow for pt to be as independent as possible, however, he cont to be unreceptive to education. VCs provided throughout for suggestions to use hospital bed functions to his advantage and positioning of LEs to assist with bed mobility. Pt becoming slightly more receptive to education towards end of session.  Pants donned total A in supine, using leg loops to  assist with rolling. He was able to transfer to EOB with close supervision using leg loops. Pt left seated EOB at end of session with RN and PT present.   Therapy Documentation Precautions:  Precautions Precautions: Fall Precaution Comments: Monitor BP, ACEs, TEDs Restrictions Weight Bearing Restrictions: No RUE Weight Bearing: Non weight bearing Pain: Pain Assessment Pain Assessment: No/denies pain (premedicated)  See Function Navigator for Current Functional Status.   Therapy/Group: Individual Therapy  [name], Chaise Passarella C 01/19/2016, 7:13 AM

## 2016-01-19 NOTE — Progress Notes (Signed)
Physical Therapy Session Note  Patient Details  Name: Matthew [name] MRN: [id number] Date of Birth: 01-22-1991  Today's Date: 01/19/2016 PT Individual Time: 1300-1358 PT Individual Time Calculation (min): 58 min   Short Term Goals: Week 3:  PT Short Term Goal 1 (Week 3): = LTGs overall supervision/mod I w/c level due to d/c plan now SNF  Skilled Therapeutic Interventions/Progress Updates:    Pt wanting to work on stretching and strengthening exercises. Pt propelled w/c down to therapy gym mod I with extra time. Transfers with slideboard with min assist due to board placement, balance, and assist to manage LE's during transfer. In supine engaged in self LE stretching using leg loops for knee to chest and hip IR/ER and therapist assisted hamstring stretching. Neuro re-ed to address BLE motor control for SAQ and heel slides on R and then for L using powder board in sidelying position pt able to activate hip flexion/extension and knee extension adn performed until fatigue. Mod assist for bed mobility on flat mat using leg loops to assist and sitting edge of bed completed with yellow medicine ball PNF diagonals and B shoulder flexion x10 reps each x 2 sets with min assist for balance.   Therapy Documentation Precautions:  Precautions Precautions: Fall Precaution Comments: Monitor BP, ACEs, TEDs   Pain: No complaints        See Function Navigator for Current Functional Status.   Therapy/Group: Individual Therapy  Karolee StampsGray, Matthew [name]  Matthew Ron B. [name], PT, DPT  01/19/2016, 2:00 PM

## 2016-01-19 NOTE — Progress Notes (Signed)
Physical Therapy Session Note  Patient Details  Name: Matthew SmartJonathan XXXLyons MRN: [id number] Date of Birth: 08-May-1991  Today's Date: 01/19/2016 PT Individual Time: 1105-1205 PT Individual Time Calculation (min): 60 min   Short Term Goals: \Week 3:  PT Short Term Goal 1 (Week 3): = LTGs overall supervision/mod I w/c level due to d/c plan now SNF  Skilled Therapeutic Interventions/Progress Updates:  Tx focused on functional mobility training with sliding board and therex for strengthening and stretching. Pt up EOB upon arrival.  Pt encouraged to direct self-cares for sliding board transfer and WC parts management.  Pt needed A for board placement and foot placement, but otherwise needed only Min A for transfer to R.   WC propuslion x150' Mod I in controlled setting.   Standing frame while playing black jack 2x8 min and 1x10 min with focus on minimal use UEs for trunk control. Pt unable to sustain balance without 1 UE and uses UEs to attain midline trunk.   Unsupported sitting edge of WC for trunk control task with yellow medicine ball for reaching in all directions in straight plane and rotational movements with close S.   Supported sitting for LE stretching with pt using leg loops to hold tailors position for functional tasks. All other hip, knee, and ankle stretches passive x1 min each to tolerance.   Discussed and reinforced weight shifting tasks and skin protection as well as encouraging healthy diet for optimal wellness. [facility] with all needs in reach.      Therapy Documentation Precautions:  Precautions Precautions: Fall Precaution Comments: Monitor BP, ACEs, TEDs Restrictions Weight Bearing Restrictions: No RUE Weight Bearing: Non weight bearing   Pain: 2/10 "my right side" modified tx prn   See Function Navigator for Current Functional Status.   Therapy/Group: Individual Therapy  Clovia Reine, Chrisandra NettersOLE M  Terrion Gencarelli, PT, DPT    01/19/2016, 11:15 AM
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
## 2016-01-19 NOTE — Plan of Care (Signed)
Problem: SCI BOWEL ELIMINATION Goal: RH STG MANAGE BOWEL WITH ASSISTANCE STG Manage Bowel with total Assistance.  Outcome: Not Progressing Patient refusing bowel program. LBM 01/16/2016 with sorbitol
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
## 2016-01-19 NOTE — Plan of Care (Signed)
Problem: SCI BOWEL ELIMINATION Goal: RH STG SCI MANAGE BOWEL PROGRAM W/ASSIST OR AS APPROPRIATE STG SCI Manage bowel program w/min assist or as appropriate.  Outcome: Not Progressing Requesting total assist

## 2016-01-19 NOTE — Progress Notes (Signed)
Occupational Therapy Session Note  Patient Details  Name: Matthew [name] MRN: [id number] Date of Birth: [birth year]  Today's Date: 01/19/2016 OT Individual Time: 6606-3016 -  (10 minutes of toileting needs - only charging for 60 minutes) OT Individual Time Calculation (min): 70 min   Short Term Goals: Week 1:  OT Short Term Goal 1 (Week 1): Pt will complete BSC transfer with max A +1 with +2 for safety if needed OT Short Term Goal 1 - Progress (Week 1): Met OT Short Term Goal 2 (Week 1): Pt will dress UB with set-up assist.  OT Short Term Goal 2 - Progress (Week 1): Not met OT Short Term Goal 3 (Week 1): Pt will tolerate 3 consecutive hours up in chair in order to increase functional activity tolerance OT Short Term Goal 3 - Progress (Week 1): Met OT Short Term Goal 4 (Week 1): Pt will don pants with max A OT Short Term Goal 4 - Progress (Week 1): Not met   Week 2:  OT Short Term Goal 1 (Week 2): Pt will complete LB dressing in circle sit with mod A OT Short Term Goal 1 - Progress (Week 2): Not met OT Short Term Goal 2 (Week 2): Pt will position self in circle sit with min A in prep for LB dressing OT Short Term Goal 2 - Progress (Week 2): Met OT Short Term Goal 3 (Week 2): Pt will wash LE with min A OT Short Term Goal 3 - Progress (Week 2): Not met OT Short Term Goal 4 (Week 2): Pt will dress UB in w/c position with set-up  OT Short Term Goal 4 - Progress (Week 2): Met OT Short Term Goal 5 (Week 2): Pt will transfer to [facility] with max A +1 OT Short Term Goal 5 - Progress (Week 2): Met   Week 3:  OT Short Term Goal 1 (Week 3): Pt will complete 3/3 toileting steps in real or simulated toileting task with min A OT Short Term Goal 2 (Week 3): Pt will complete sliding board transfer w/c > therapy mat with set-up/ supervision to increase independence with functional tasks OT Short Term Goal 3 (Week 3): Pt will donn/ doff B socks with min A in circle sit position in prep for full  LB  bathing/dressing task  Skilled Therapeutic Interventions/Progress Updates:  Patient found seated in w/c asleep, pt easy to awake and arouse. Pt with 7/10 complaints of pain in right UE, monitored this during session. Pt donned bilateral w/c gloves and propelled self from room to ortho gym. Pt engaged in therapeutic exercise using para gym:  pt performed downward pulley at 5lbs X15, scapular retraction X15, downward pully at 7.5lbs X15, core rotation to right X10, core rotation to left X10. Pt then with request to use BR. Therapist assisted pt from gym to room and pt transferred w/c to drop arm BSC. Pt performed slide board transfer with min assist and min assist verbal cues for correct placement of w/c. Pt sat on BSC and performed lateral leans to doff brief and pants. Pt states he has not had an accident in his brief, does he need one for everyday use? Pt required +2 for peri cleansing due to decreased sitting balance. Once clean, pt transferred [facility] to EOB with +2 for safety due to no clothes on during this transfer. From here, assisted pt to supine and therapist +RN assisted with pulling pants up to waist. Left pt supine in bed with all needs within reach.  Therapy Documentation Precautions:  Precautions Precautions: Fall Precaution Comments: Monitor BP, ACEs, TEDs Restrictions Weight Bearing Restrictions: No RUE Weight Bearing: Non weight bearing Therapy Vitals Temp: 98.2 F (36.8 C) Temp Source: Oral Pulse Rate: 82 Resp: 18 BP: (!) 114/59 mmHg Patient Position (if appropriate): Sitting Oxygen Therapy SpO2: 100 % O2 Device: Not Delivered  See Function Navigator for Current Functional Status.  Therapy/Group: Individual Therapy  Chrys Racer , MS, OTR/L, CLT  01/19/2016, 4:18 PM

## 2016-01-20 ENCOUNTER — Inpatient Hospital Stay (HOSPITAL_COMMUNITY): Payer: Self-pay | Admitting: Occupational Therapy

## 2016-01-20 NOTE — Plan of Care (Signed)
OT goals modified. LB bathing/dressing and toileting goals modified to overall mod- max A. PT is limited in LE movements due to increased tone in muscle spasms, especially during AM bathing/dressing OT sessions. Stretching and medication assist with tone management, however, tone making it difficulty to obtain/ maintain circle sit position needed for LB dressing/bathing tasks. Pt has demonstrated ability to obtain and maintain circle sit position on therapy mat to don/doff sock during tx sessions in therapy gym. See POC for goal details. Johnsie Cancel- Shamar Kracke [name], OTR/L

## 2016-01-20 NOTE — Progress Notes (Signed)
[place] PHYSICAL MEDICINE & REHABILITATION     PROGRESS NOTE    Subjective/Complaints: Only complains of right shoulder pain- this is not new.  No other complaints   Objective: Vital Signs: Blood pressure 114/59, pulse 82, temperature 98.5 F (36.9 C), temperature source Oral, resp. rate 18, height 5\' 7"  (1.702 m), weight 201 lb (91.173 kg), SpO2 100 %.   Physical Exam:  nad Chest cta cv reg rate abd- soft, nt, nd,  Ext- no edema   Assessment/Plan: 1. Paraparesis secondary to T1 SCI    Medical Problem List and Plan: 1. Paraplegia and functional deficits secondary to T1 SCI -continue therapies. SNF pending    2. DVT Prophylaxis/Anticoagulation: Pharmaceutical: Lovenox 3. Pain Management:  Reviewed meds. Would like to start to minimize all pain meds 4. Mood: immature  5. Neuropsych: This patient is capable of making decisions on his own behalf. 6. Skin/Wound Care: Air mattress overlay for prevention of breakdown.    7. Fluids/Electrolytes/Nutrition: Monitor I/O.  Push PO fluids 8. Hypokalemia: resolved  Lab Results  Component Value Date   K 4.5 01/03/2016    9. Right pical pleural hematoma: Respiratory status stable.  10. Thrombocytopenia: Resolved. Monitor with Lovenox on board.  11. Neurogenic bladder:    -continue urecholine which has helped spontaneous voids. I/o cath prn   12. Neurogenic bowel:continues to refuse suppository  -bid laxative   LOS (Days) 18 A FACE TO FACE EVALUATION WAS PERFORMED  Shahram Alexopoulos H Zaynab Chipman 01/20/2016 6:40 AM

## 2016-01-20 NOTE — Progress Notes (Signed)
Occupational Therapy Session Note  Patient Details  Name: Matthew [name] MRN: [id number] Date of Birth: 04-28-1991  Today's Date: 01/20/2016 OT Individual Time: [id number] OT Individual Time Calculation (min): 41 min    Short Term Goals: Week 3:  OT Short Term Goal 1 (Week 3): Pt will complete 3/3 toileting steps in real or simulated toileting task with min A OT Short Term Goal 2 (Week 3): Pt will complete sliding board transfer w/[name] > therapy mat with set-up/ supervision to increase independence with functional tasks OT Short Term Goal 3 (Week 3): Pt will donn/ doff B socks with min A in circle sit position in prep for full  LB bathing/dressing task  Skilled Therapeutic Interventions/Progress Updates:    Pt seen for OT session focusing on ADL re-training, functional transfers and UE strengthening. Pt in supine upon arrival, easily awoken anad agreeable to tx session. ACE wraps,TED hose, and leg loops donned total A. He transferred to EOB with supervision utilizing leg loops and hospital bed functions. Min A sliding board transfer completed to w/[name] with min A with assist to place board, manage LEs and for balance. Grooming completed mod I at sink from w/[name] level. He self propelled w/[name] to therapy gym for UE strengthening and conditioning. In gym completed UE strengthening exercises utilizing #5 dowel. Completed x2 sets of 10 overhead press, shoulder press and bicep curls. VCs provided for proper form and technique. Pt voiced slight discomfort with exercises in R UE due to stiffness as pt guards R UE during mobility and functional tasks.  Pt returned to room at end of session mod I, left with all needs in reach.   Therapy Documentation Precautions:  Precautions Precautions: Fall Precaution Comments: Monitor BP, ACEs, TEDs Restrictions Weight Bearing Restrictions: No RUE Weight Bearing: Non weight bearing  See Function Navigator for Current Functional Status.   Therapy/Group: Individual  Therapy  Matthew [name], Matthew [name] 01/20/2016, 6:00 AM

## 2016-01-21 ENCOUNTER — Inpatient Hospital Stay (HOSPITAL_COMMUNITY): Payer: Medicaid Other | Admitting: Physical Therapy

## 2016-01-21 NOTE — Progress Notes (Signed)
[place] PHYSICAL MEDICINE & REHABILITATION     PROGRESS NOTE    Subjective/Complaints: Still has some right shoulder pain. Otherwise no complaints.   Objective: Vital Signs: Blood pressure 110/78, pulse 84, temperature 97.7 F (36.5 C), temperature source Oral, resp. rate 18, height 5\' 7"  (1.702 m), weight 201 lb (91.173 kg), SpO2 98 %.   Physical Exam:  No acute distress. HEENT exam: Atraumatic, normocephalic Chest clear to auscultation, Cardiac exam S1 and S2 are regular Abdominal exam active bowel sounds, soft Extremities no edema Neurologic exam is able to move his upper extremities bilaterally. Involuntary movement of his legs only.   Assessment/Plan: 1. Paraparesis secondary to T1 SCI    Medical Problem List and Plan: 1. Paraplegia and functional deficits secondary to T1 SCI -continue therapies. SNF pending    2. DVT Prophylaxis/Anticoagulation: Pharmaceutical: Lovenox 3. Pain Management:  Currently on baclofen, Naprosyn, OxyContin, tramadol. 4. Mood: immature  5. Neuropsych: This patient is capable of making decisions on his own behalf. 6. Skin/Wound Care: Air mattress overlay for prevention of breakdown.    7. Fluids/Electrolytes/Nutrition: Monitor I/O.  Push PO fluids 8. Hypokalemia: resolved  Lab Results  Component Value Date   K 4.5 01/03/2016    9. Right pical pleural hematoma: Respiratory status stable.  10. Thrombocytopenia: Resolved. Monitor with Lovenox on board.  11. Neurogenic bladder:    -continue urecholine    12. Neurogenic bowel:continues to refuse suppository  -bid laxative   LOS (Days) 19 A FACE TO FACE EVALUATION WAS PERFORMED  Kymberley Raz H Adreanne Yono 01/21/2016 8:11 AM

## 2016-01-21 NOTE — Progress Notes (Signed)
Physical Therapy Session Note  Patient Details  Name: Matthew SmartJonathan XXXLyons MRN: [id number] Date of Birth: 02/24/1991  Today's Date: 01/21/2016 PT Individual Time: 1333-1430 PT Individual Time Calculation (min): 57 min   Short Term Goals: Week 3:  PT Short Term Goal 1 (Week 3): = LTGs overall supervision/mod I w/c level due to d/c plan now SNF  Skilled Therapeutic Interventions/Progress Updates:    Pt received in w/c & agreeable to PT, noting 7/10 pain in R shoulder/chest but reports being premedicated. PT provided total A for donning pt's BLE ted hose; pt reported he felt temperature of floor when touching it with foot. Pt self propelled w/c x 100 ft room>gym with Mod I & extra time. Pt able to remove BLE leg rests & assist with w/c set up at standing frame; pt performed multiple BUE w/c pushups to allow PT to pull up pants. Pt tolerated standing in standing frame x 19 minutes + 5 minutes while playing checkers & connect 4. Pt returned to w/c & transported back to room for time management. Pt left in w/c with all needs within reach.   Therapy Documentation Precautions:  Precautions Precautions: Fall Precaution Comments: Monitor BP, ACEs, TEDs Restrictions Weight Bearing Restrictions: No RUE Weight Bearing: Non weight bearing  Pain: Pain Assessment Pain Assessment: 0-10 Pain Score: 7  Pain Location:  (R shoulder/chest) Pain Intervention(s):  (premedicated)   See Function Navigator for Current Functional Status.   Therapy/Group: Individual Therapy  Sandi MariscalVictoria M Miller 01/21/2016, 12:56 PM

## 2016-01-22 ENCOUNTER — Inpatient Hospital Stay (HOSPITAL_COMMUNITY): Payer: Self-pay | Admitting: Occupational Therapy

## 2016-01-22 ENCOUNTER — Inpatient Hospital Stay (HOSPITAL_COMMUNITY): Payer: Medicaid Other

## 2016-01-22 ENCOUNTER — Encounter (HOSPITAL_COMMUNITY): Payer: Self-pay

## 2016-01-22 ENCOUNTER — Inpatient Hospital Stay (HOSPITAL_COMMUNITY): Payer: Medicaid Other | Admitting: Physical Therapy

## 2016-01-22 ENCOUNTER — Inpatient Hospital Stay (HOSPITAL_COMMUNITY): Payer: Self-pay | Admitting: Physical Therapy

## 2016-01-22 NOTE — Progress Notes (Signed)
Occupational Therapy Session Note  Patient Details  Name: Matthew SmartJonathan XXXLyons MRN: [id number] Date of Birth: 16-May-1991  Today's Date: 01/22/2016 OT Individual Time: 0945-1100 OT Individual Time Calculation (min): 75 min    Short Term Goals: Week 3:  OT Short Term Goal 1 (Week 3): Pt will complete 3/3 toileting steps in real or simulated toileting task with min A OT Short Term Goal 2 (Week 3): Pt will complete sliding board transfer w/c > therapy mat with set-up/ supervision to increase independence with functional tasks OT Short Term Goal 3 (Week 3): Pt will donn/ doff B socks with min A in circle sit position in prep for full  LB bathing/dressing task  Skilled Therapeutic Interventions/Progress Updates:    Pt seen for OT session focusing on functional positioning and stretching. Pt sitting up in w/c upon arrival, agreeable to tx session. Given option to work on toileting or circle sitting. Pt voiced that he is "good" with toileting, voicing that he completed toilet transfers on/off BSC and managed clothing this weekend.  He self propelled w/c to gym mod I with increased time. Encouragement required to get off cell phone to head to gym, hwoever, once in gym phone was not a distraction this session.  He transferred onto therapy mat with assist to place board and min A for LE management, pt able to utilize leg loops to assist. He transferred to supine on mat mod A. LE stretches performed. PT able to use leg loops on L LE to bring knee to chest for stretch. He was unable to perform this on R LE due to increased tone, unable to break up tone independently. Stretches performed on B LEs to hamstrings, glutes, and IT bands. Pt able to tolerate full supine position with LEs straight out due to imrpoved hamstring flexibility. However, he is not able to tolerate full long sitting position due to tight hamstrings.  He was positioned into circle sitting on mat. Able to maintain static and light dynamic balance  in circle sit with supervision. When manipulating LEs to work on doffing/donning sock, required min- total A to maintain balance.  Supine rest breaks required following doffing/ donning each sock.  Following dressing he requested more stretching for B LEs. Streteches to hamstring, hip flexors, IT band, and heel cords performed. Extensive education provided regarding SCI using diagrams and examples discusing why pt  Has the deficits he does, sensory and motor pathways, etc.  Min A sliding board transfer completed back to chair. He returned to room mod I, all needs in reach.   Therapy Documentation Precautions:  Precautions Precautions: Fall Precaution Comments: Monitor BP, ACEs, TEDs Restrictions Weight Bearing Restrictions: No RUE Weight Bearing: Non weight bearing Pain: Pain Assessment Pain Assessment: No/denies pain  See Function Navigator for Current Functional Status.   Therapy/Group: Individual Therapy  [name], Anesha Hackert C 01/22/2016, 7:13 AM
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
## 2016-01-22 NOTE — Progress Notes (Signed)
Physical Therapy Session Note  Patient Details  Name: Matthew SmartJonathan XXXLyons MRN: [id number] Date of Birth: 05/24/1991  Today's Date: 01/22/2016 PT Individual Time: 1300-1400 PT Individual Time Calculation (min): 60 min   Short Term Goals: Week 3:  PT Short Term Goal 1 (Week 3): = LTGs overall supervision/mod I w/c level due to d/c plan now SNF  Skilled Therapeutic Interventions/Progress Updates:    W/c mobility training on unit to/from therapies and in ADL apartment at mod I level with extra time. Focused on functional transfers with slideboard to/from regular bed and bed mobility re-training with various techniques (long sitting to supine, supine <> sidelying) to determine which way worked best for pt to be most independent. Required overall min assist and verbal cues for technique. Pt able to manage trunk without physical assist to get into/out of long sitting. PT changed w/c cushion and added apple board for support. This cushion cover allows easier placement and removal of slideboard. Pt able to perform transfer with close supervision to steady assist with PT placing slideboard. End of session left up in w/c with all needs in reach.   Therapy Documentation Precautions:  Precautions Precautions: Fall Precaution Comments: Monitor BP, ACEs, TEDs Restrictions Weight Bearing Restrictions: No RUE Weight Bearing: Non weight bearing  Pain: Pain Assessment Pain Score: 0-No pain    See Function Navigator for Current Functional Status.   Therapy/Group: Individual Therapy  Karolee StampsGray, Imanni Burdine Darrol PokeBrescia  Maranatha Grossi B. Rolande Moe, PT, DPT  01/22/2016, 2:18 PM

## 2016-01-22 NOTE — Progress Notes (Signed)
Physical Therapy Session Note  Patient Details  Name: Matthew [name] MRN: [id number] Date of Birth: 1990-09-27  Today's Date: 01/22/2016 PT Individual Time: 1500-1600 PT Individual Time Calculation (min): 60 min   Short Term Goals: Week 3:  PT Short Term Goal 1 (Week 3): = LTGs overall supervision/mod I w/c level due to d/c plan now SNF  Skilled Therapeutic Interventions/Progress Updates:   Pt received in w/c eating a snack.  Pt requesting to perform stretching due to spasms/hypertonicity and then work on supported standing.  Pt performed w/c mobility to gym mod I and performed all LE management and w/c set up/parts management Mod I.  Continues to require assistance to place board fully under hips.  Performed slideboard w/c > mat with min A to stabilize LE to prevent pt from sliding off of board.  On mat performed sit > supine with leg loops and min A.  Performed bilat LE PROM stretching and AAROM HEP for tone management.  While performing stretching pt very engaged and reports, "I know I didn't try very hard in the beginning but now I'm getting used to the therapy."  Pt reports he has one goal he would like to work on before D/C to SNF-pt would like to try standing in // bar.  States that even if he isn't able to stand for very long or walk, he would at least like to try once.  Will discuss with primary PT.  Performed supine > sit with leg loops and mod A to bring trunk upright once LE lowered off mat.  Pt set up in standing frame for NMR; see below for details.  Performed slideboard back to w/c with min A.  Pt left in room in w/c with all items within reach.  Therapy Documentation Precautions:  Precautions Precautions: Fall Precaution Comments: Monitor BP, ACEs, TEDs Restrictions Weight Bearing Restrictions: No RUE Weight Bearing: Non weight bearing Vital Signs: Therapy Vitals Temp: 99.3 F (37.4 C) Temp Source: Oral Pulse Rate: 76 Resp: 18 BP: 124/79 mmHg Patient Position (if  appropriate): Sitting Oxygen Therapy SpO2: 100 % O2 Device: Not Delivered Pain:  Reporting 7/10 pain in UE but requesting to wait until after therapy for pain medication  Other Treatments: Treatments Neuromuscular Facilitation: Right;Left;Lower Extremity;Forced use;Activity to increase coordination;Activity to increase motor control;Activity to increase timing and sequencing;Activity to increase sustained activation;Activity to increase lateral weight shifting in supported standing in standing frame with pt performing partial/terminal sit <> stands from sling, lateral weight shifting with focus on sustained activation of hip and knee extensors and lateral weight shifting and un-weighting each foot to lift it from the floor-pt able to flex and lift LLE fully from floor but unable to elevate R foot from floor.     See Function Navigator for Current Functional Status.   Therapy/Group: Individual Therapy  Edman CircleHall, Daziya [facility] 01/22/2016, 4:29 PM

## 2016-01-22 NOTE — Progress Notes (Signed)
Occupational Therapy Note  Patient Details  Name: Matthew [name] MRN: [id number] Date of Birth: 09-13-1990  Today's Date: 01/22/2016 OT Individual Time: 1400-1430 OT Individual Time Calculation (min): 30 min   Pt c/o 7/10 generalized pain; premedicated Individual therapy  Pt propelled to and from gym at mod I level.  Pt engaged in BUE therex on weight machine.  Pt initially engaged in rowing (3 sets X 10 at 10 lbs) and transitioned to lat pull (3 sets X 10 - 10#, 15#, and 20#). Pt returned to room and remained in w/c awaiting next therapy; all needs within reach.   Matthew [name], Matthew [name] [facility] 01/22/2016, 3:01 PM

## 2016-01-22 NOTE — Plan of Care (Signed)
Problem: SCI BOWEL ELIMINATION Goal: RH STG SCI MANAGE BOWEL PROGRAM W/ASSIST OR AS APPROPRIATE STG SCI Manage bowel program w/min assist or as appropriate.  Outcome: Not Progressing Patient doesn't follow the bowel program implemented by staff

## 2016-01-22 NOTE — Progress Notes (Signed)
Physical Therapy Session Note  Patient Details  Name: Matthew [name] MRN: [id number] Date of Birth: Mar 08, 1991  Today's Date: 01/22/2016 PT Individual Time: 0900-0930 PT Individual Time Calculation (min): 30 min   Short Term Goals: Week 3:  PT Short Term Goal 1 (Week 3): = LTGs overall supervision/mod I w/c level due to d/c plan now SNF  Skilled Therapeutic Interventions/Progress Updates:    Pt received asleep in bed, slow to awake however ultimately agreeable to treatment. Requests to use urinal; performs with setupA. Ted hose, ace wraps, non-skid socks and leg loops donned totalA. Supine>sit with leg loops, HOB mildly elevated, and bedrails with close S. Requires assist for lifting LE to place/remove transfer board. Transfer performed min guard with therapist blocking B knees, min cues for repositioning feet. Pt dons leg rests, however requires assist to place RLE onto leg rest. Pt remained seated in w/c at completion of session with setup for breakfast; all needs within reach.   Therapy Documentation Precautions:  Precautions Precautions: Fall Precaution Comments: Monitor BP, ACEs, TEDs Restrictions Weight Bearing Restrictions: No RUE Weight Bearing: Non weight bearing Pain: Pain Assessment Pain Assessment: No/denies pain   See Function Navigator for Current Functional Status.   Therapy/Group: Individual Therapy  Matthew [name] 01/22/2016, 9:43 AM

## 2016-01-23 ENCOUNTER — Inpatient Hospital Stay (HOSPITAL_COMMUNITY): Payer: Medicaid Other

## 2016-01-23 ENCOUNTER — Inpatient Hospital Stay (HOSPITAL_COMMUNITY): Payer: Self-pay | Admitting: Occupational Therapy

## 2016-01-23 ENCOUNTER — Inpatient Hospital Stay (HOSPITAL_COMMUNITY): Payer: Medicaid Other | Admitting: Occupational Therapy

## 2016-01-23 NOTE — Progress Notes (Signed)
Occupational Therapy Session Note  Patient Details  Name: Matthew [name] MRN: [id number] Date of Birth: 1990-10-20  Today's Date: 01/23/2016 OT Individual Time: 1100-1157 OT Individual Time Calculation (min): 57 min    Short Term Goals: Week 3:  OT Short Term Goal 1 (Week 3): Pt will complete 3/3 toileting steps in real or simulated toileting task with min A OT Short Term Goal 2 (Week 3): Pt will complete sliding board transfer w/c > therapy mat with set-up/ supervision to increase independence with functional tasks OT Short Term Goal 3 (Week 3): Pt will donn/ doff B socks with min A in circle sit position in prep for full  LB bathing/dressing task   Skilled Therapeutic Interventions/Progress Updates:    Upon entering the room, pt seated in wheelchair awaiting therapist. Pt with no c/o pain this session and very agreeable to OT intervention. Skilled OT intervention with focus on therapeutic exercise and education. Pt engaged in 3 sets of 10 wheelchair pushups with supervision for safety. OT provided pt with 5 lb hand weight for B UE exercises for shoulder and elbow in all planes x 10 reps of each. Pt discussed with OT his concerns and plans for future goals related to therapy intervention. Pt remained in wheelchair at end of session with call bell and all needed items within reach.   Therapy Documentation Precautions:  Precautions Precautions: Fall Precaution Comments: Monitor BP, ACEs, TEDs Restrictions Weight Bearing Restrictions: No RUE Weight Bearing: Non weight bearing   Pain: Pain Assessment Pain Assessment: 0-10 Pain Score: 5  Pain Intervention(s): Medication (See eMAR)  See Function Navigator for Current Functional Status.   Therapy/Group: Individual Therapy  [name], Iasha Mccalister L 01/23/2016, 12:51 PM
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
## 2016-01-23 NOTE — Progress Notes (Signed)
Physical Therapy Session Note  Patient Details  Name: Matthew [name] MRN: [id number] Date of Birth: [birth year]  Today's Date: 01/23/2016 PT Individual Time: 1300-1358 PT Individual Time Calculation (min): 58 min   Short Term Goals: Week 3:  PT Short Term Goal 1 (Week 3): = LTGs overall supervision/mod I w/c level due to d/c plan now SNF  Skilled Therapeutic Interventions/Progress Updates:   Session focused on w/c mobility and parts management at mod I level, functional slideboard transfers with overall min assist for balance and assist for slideboard placement, bed mobility re-training on flat mat using leg loops with cues for trial of various techniques (min assist overall), neuro re-ed for BLE motor activation in supine for hip flexion, knee extension and hip abduction/adduction (3-/5 on L and 2-/5 on R), supine stretching and demonstrating self stretching techniques for hamstrings and piriformis, and dynamic sitting balance and UE muscular endurance activity for boxing sitting edge of mat (supervision for sitting balance).   Therapy Documentation Precautions:  Precautions Precautions: Fall Precaution Comments: Monitor BP, ACEs, TEDs  Pain: No complaints of pain.   See Function Navigator for Current Functional Status.   Therapy/Group: Individual Therapy  Karolee StampsGray, Jacquelinne Speak Darrol PokeBrescia  Ereka Brau B. Taneah Masri, PT, DPT  01/23/2016, 2:18 PM

## 2016-01-23 NOTE — Progress Notes (Signed)
Occupational Therapy Session Note  Patient Details  Name: Matthew [name] MRN: [id number] Date of Birth: 02-15-1991  Today's Date: 01/23/2016 OT Individual Time: 1430-1525 OT Individual Time Calculation (min): 55 min   Short Term Goals: Week 1:  OT Short Term Goal 1 (Week 1): Pt will complete BSC transfer with max A +1 with +2 for safety if needed OT Short Term Goal 1 - Progress (Week 1): Met OT Short Term Goal 2 (Week 1): Pt will dress UB with set-up assist.  OT Short Term Goal 2 - Progress (Week 1): Not met OT Short Term Goal 3 (Week 1): Pt will tolerate 3 consecutive hours up in chair in order to increase functional activity tolerance OT Short Term Goal 3 - Progress (Week 1): Met OT Short Term Goal 4 (Week 1): Pt will don pants with max A OT Short Term Goal 4 - Progress (Week 1): Not met   Week 2:  OT Short Term Goal 1 (Week 2): Pt will complete LB dressing in circle sit with mod A OT Short Term Goal 1 - Progress (Week 2): Not met OT Short Term Goal 2 (Week 2): Pt will position self in circle sit with min A in prep for LB dressing OT Short Term Goal 2 - Progress (Week 2): Met OT Short Term Goal 3 (Week 2): Pt will wash LE with min A OT Short Term Goal 3 - Progress (Week 2): Not met OT Short Term Goal 4 (Week 2): Pt will dress UB in w/c position with set-up  OT Short Term Goal 4 - Progress (Week 2): Met OT Short Term Goal 5 (Week 2): Pt will transfer to [facility] with max A +1 OT Short Term Goal 5 - Progress (Week 2): Met   Week 3:  OT Short Term Goal 1 (Week 3): Pt will complete 3/3 toileting steps in real or simulated toileting task with min A OT Short Term Goal 2 (Week 3): Pt will complete sliding board transfer w/c > therapy mat with set-up/ supervision to increase independence with functional tasks OT Short Term Goal 3 (Week 3): Pt will donn/ doff B socks with min A in circle sit position in prep for full  LB bathing/dressing task  Skilled Therapeutic Interventions/Progress  Updates:  Pt found seated in w/c on cell phone. Pt donned bilateral w/c gloves. Pt worked on opening door to leave room. Pt propelled to w/c accessible bathroom and practiced getting in/out of BR and w/c set-up for toileting needs. Pt then propelled self to ADL kitchen. Pt prepared scrambled eggs in ADL kitchen, gathering all necessary items and performing prep from w/c level. Pt with good problem solving skills and only required minimal cueing from therapist for safety. From here, pt propelled self to therapy gym and engaged in core strengthening and LE strengthening exercises using 3lb weighted ball. Pt tired at end of session and asked therapist to assist with propelling him back to room. Left pt seated in w/c with all needs within reach.   Therapy Documentation Precautions:  Precautions Precautions: Fall Precaution Comments: Monitor BP, ACEs, TEDs Restrictions Weight Bearing Restrictions: No RUE Weight Bearing: Non weight bearing  Vital Signs: Therapy Vitals Temp: 97.9 F (36.6 C) Temp Source: Oral Pulse Rate: 65 Resp: 16 BP: 111/60 mmHg Patient Position (if appropriate): Lying Oxygen Therapy SpO2: 100 % O2 Device: Not Delivered  See Function Navigator for Current Functional Status.  Therapy/Group: Individual Therapy  Chrys Racer , MS, OTR/L, CLT  01/23/2016, 3:38 PM
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
## 2016-01-23 NOTE — Progress Notes (Signed)
Occupational Therapy Session Note  Patient Details  Name: Matthew SmartJonathan XXXLyons MRN: [id number] Date of Birth: 25-Jul-1991  Today's Date: 01/23/2016 OT Individual Time: 0920-1015 OT Individual Time Calculation (min): 55 min    Short Term Goals: Week 3:  OT Short Term Goal 1 (Week 3): Pt will complete 3/3 toileting steps in real or simulated toileting task with min A OT Short Term Goal 2 (Week 3): Pt will complete sliding board transfer w/c > therapy mat with set-up/ supervision to increase independence with functional tasks OT Short Term Goal 3 (Week 3): Pt will donn/ doff B socks with min A in circle sit position in prep for full  LB bathing/dressing task  Skilled Therapeutic Interventions/Progress Updates:    Pt seen for OT session focusing on ADL re-training and functional mobility. Pt in supine upon arrival, agreeable to tx session. TED hose and ACEs donned total A- pt declined bathing/dressing session this morning. While therapist donned TED hose and ACE wraps attempted to engage pt in conversation regarding d/c planning, however, pt with limited engagement and pre- occupied with cell phone.  He was able to transfer to EOB supervision- mod I not using leg loops, able to walk B LEs off EOB with increased time and effort. Min A slide board transfer completed to w/c. He completed grooming mod I at sink. He self propelled w/c to therapy gym for UE strengthening. Completed UE strengthening exercises using 5# dowel rod. Completed overhead press, chest press, and bicep curls x2 sets of 10 reps. Using 6# dumbbell completed tricep kickbacks and cross chest punches using B UEs. VCs provided throughout for proper form and technique. Pt returned to room mod I, all needs in reach.   Therapy Documentation Pain: Pain Assessment Pain Assessment: 0-10 Pain Score: 5  Pain Type: Acute pain Pain Location: Shoulder Pain Orientation: Right Pain Descriptors / Indicators: Aching Pain Frequency:  Intermittent Pain Onset: On-going Patients Stated Pain Goal: 2 Pain Intervention(s): Medication (See eMAR), RN aware, repositioned, increased activity  See Function Navigator for Current Functional Status.   Therapy/Group: Individual Therapy  [name], Jamear Carbonneau C 01/23/2016, 6:48 AM

## 2016-01-24 ENCOUNTER — Inpatient Hospital Stay (HOSPITAL_COMMUNITY): Payer: Medicaid Other | Admitting: Physical Therapy

## 2016-01-24 ENCOUNTER — Inpatient Hospital Stay (HOSPITAL_COMMUNITY): Payer: Self-pay | Admitting: Occupational Therapy

## 2016-01-24 ENCOUNTER — Inpatient Hospital Stay (HOSPITAL_COMMUNITY): Payer: Self-pay | Admitting: Physical Therapy

## 2016-01-24 ENCOUNTER — Inpatient Hospital Stay (HOSPITAL_COMMUNITY): Payer: Medicaid Other | Admitting: Occupational Therapy

## 2016-01-24 MED ORDER — BACLOFEN 10 MG PO TABS
10.0000 mg | ORAL_TABLET | Freq: Four times a day (QID) | ORAL | Status: DC
  Administered 2016-01-24 – 2016-01-31 (×24): 10 mg via ORAL
  Filled 2016-01-24 (×26): qty 1

## 2016-01-24 NOTE — Progress Notes (Signed)
Physical Therapy Session Note  Patient Details  Name: Ginnie SmartJonathan XXXLyons MRN: [id number] Date of Birth: February 14, 1991  Today's Date: 01/24/2016 PT Individual Time: 1000-1100 PT Individual Time Calculation (min): 60 min   Short Term Goals: Week 3:  PT Short Term Goal 1 (Week 3): = LTGs overall supervision/mod I w/c level due to d/c plan now SNF  Skilled Therapeutic Interventions/Progress Updates:   Session focused on w/c mobility and parts management at mod I level, functional slideboard transfers x 3 with overall supervision for balance and assist to complete slideboard placement with patient using leg loops to cross one leg over the other and initiating placement of board, dynamic sitting balance edge of mat with feet supported and no UE support while engaging in Wii bowling, bed mobility re-training on flat mat table using leg loops with min A for sit > supine and supervision for supine > sit, and supine PROM BLE for muscle extensibility. Patient left sitting in wheelchair with all needs in reach.   Therapy Documentation Precautions:  Precautions Precautions: Fall Precaution Comments: Monitor BP, ACEs, TEDs Restrictions Weight Bearing Restrictions: No RUE Weight Bearing: Non weight bearing Pain:  No c/o pain, reports having received pain medication prior to therapy  See Function Navigator for Current Functional Status.   Therapy/Group: Individual Therapy  Kerney ElbeVarner, Breeona Waid A 01/24/2016, 10:33 AM

## 2016-01-24 NOTE — Progress Notes (Signed)
Physical Therapy Session Note  Patient Details  Name: Matthew [name] MRN: [id number] Date of Birth: May 31, 1991  Today's Date: 01/24/2016 PT Individual Time: 4132-44011434-1529 PT Individual Time Calculation (min): 55 min    Skilled Therapeutic Interventions/Progress Updates:    Pt received on mat table in gym & agreeable to PT, noting 7/10 R shoulder pain. Pt able to transfer from supine>sitting Edge of mat with Min A and cuing for repositioning of BLE. Pt utilized leg loops & able to come to long sit then transfer to edge of mat by scooting buttocks forwards with increased time. Played Wii bowling with focus on activity tolerance & sitting balance; pt mod I with sitting balance. Pt requested to try standing in parallel bars. PT set up w/c by mat table with pt managing arm rests & placing sliding board with supervision A. Pt completed sliding board transfer with supervision, required max A for removal of sliding board, but independently managed w/c parts.  Pt able to transfer sit>stand with max A +1, maintain standing with mod A for blocking B knees & BUE supported on parallel bars; pt transferred stand>sit with Mod A. Pt tolerated standing x 20 seconds + 25 seconds + 45 seconds before requiring seated rest break 2/2 BUE & overall fatigue. During standing pt with BLE knee hyperextension & required multimodal cuing for upright chest with pt able to correct. Pt able to manage w/c parts & propel x 100 ft gym>room with Mod I. At end of session pt left in w/c with all needs within reach.   Therapy Documentation Precautions:  Precautions Precautions: Fall Precaution Comments: Monitor BP, ACEs, TEDs Restrictions Weight Bearing Restrictions: No RUE Weight Bearing: Non weight bearing  Pain: Pain Assessment Pain Assessment: 0-10 Pain Score: 7  Pain Location: Shoulder Pain Orientation: Right Pain Intervention(s):  (premedicated)   See Function Navigator for Current Functional  Status.   Therapy/Group: Individual Therapy  Sandi MariscalVictoria M Jayson Waterhouse 01/24/2016, 2:25 PM

## 2016-01-24 NOTE — Plan of Care (Signed)
Problem: SCI BOWEL ELIMINATION Goal: RH STG SCI MANAGE BOWEL WITH MEDICATION WITH ASSISTANCE STG SCI Manage bowel with medication with moderate assistance.  Outcome: Not Progressing Patient refusing his Bowel program

## 2016-01-24 NOTE — Progress Notes (Signed)
Occupational Therapy Session Note  Patient Details  Name: Matthew [name] XXXLyons MRN: [id number] Date of Birth: Aug 10, 1990  Today's Date: 01/24/2016 OT Individual Time: 1400-1430 OT Individual Time Calculation (min): 30 min    Short Term Goals: Week 4:  OT Short Term Goal 1 (Week 4): STG=LTG due to planned d/c to SNF  Skilled Therapeutic Interventions/Progress Updates:  Upon entering the room, pt seated in wheelchair awaiting therapist with 7/10 c/o pain in R shoulder but reports recently taking medication prior to arrival. Pt propelled wheelchair into gym with supervision. Pt managing leg rest and B LEs with use of leg loops. Pt needing set up A for placement of slide board for transfer from wheelchair >mat with supervision. Pt laying down into supine and able to come into upright sitting with use of elbows and leg loops. Pt engaged in circle sitting and doffed R sock from foot and donned clean sock. Pt taking rest break in supine and PT seeing pt for further therapy. Pt transitioned without issue.   Therapy Documentation Precautions:  Precautions Precautions: Fall Precaution Comments: Monitor BP, ACEs, TEDs Restrictions Weight Bearing Restrictions: No RUE Weight Bearing: Non weight bearing Vital Signs: Therapy Vitals Temp: 98.8 F (37.1 C) Temp Source: Oral Pulse Rate: 65 Resp: 18 BP: 126/80 mmHg Patient Position (if appropriate): Sitting Oxygen Therapy SpO2: 100 % O2 Device: Not Delivered Pain: Pain Assessment Pain Assessment: 0-10 Pain Score: 7  Pain Type: Acute pain Pain Location: Shoulder Pain Orientation: Right Pain Descriptors / Indicators: Aching Pain Frequency: Intermittent Pain Onset: On-going Patients Stated Pain Goal: 3 Pain Intervention(s):  (premedicated)   See Function Navigator for Current Functional Status.   Therapy/Group: Individual Therapy  [name], Evonna Stoltz L 01/24/2016, 4:15 PM

## 2016-01-24 NOTE — Progress Notes (Signed)
Physical Therapy Session Note  Patient Details  Name: Matthew [name] MRN: [id number] Date of Birth: 04-16-1991  Today's Date: 01/24/2016 PT Individual Time: 1300-1330 PT Individual Time Calculation (min): 30 min   Short Term Goals: Week 3:  PT Short Term Goal 1 (Week 3): = LTGs overall supervision/mod I w/c level due to d/c plan now SNF  Skilled Therapeutic Interventions/Progress Updates:   Pt received in w/c fast asleep; pt reporting spasms have improved but he is now more sleepy throughout the day.  Pt agreeable to therapy.  Pt transported to gym in w/c and set up in standing frame with pt performing all w/c parts management.  Pt maintained standing in frame while performing dynamic bilat UE movements: 10 reps each: ball bounces against wall, ball overhead press, ball arcs with mod A to maintain trunk control and balance.  Pt performed R and L lateral weight shifting while performing R and LLE heel lifts and sliding each foot back/forwards with mod A.  Returned to w/c and to room; pt left in w/c with all items within reach to await OT session.   Therapy Documentation Precautions:  Precautions Precautions: Fall Precaution Comments: Monitor BP, ACEs, TEDs Restrictions Weight Bearing Restrictions: No RUE Weight Bearing: Non weight bearing Vital Signs: Therapy Vitals Temp: 98.8 F (37.1 C) Temp Source: Oral Pulse Rate: 65 Resp: 18 BP: 126/80 mmHg Patient Position (if appropriate): Sitting Oxygen Therapy SpO2: 100 % O2 Device: Not Delivered Pain: Pain Assessment Pain Assessment: No/denies pain Pain Score: 7  Pain Location: Shoulder Pain Orientation: Right Pain Intervention(s):  (premedicated)   See Function Navigator for Current Functional Status.   Therapy/Group: Individual Therapy  Edman CircleHall, Deneice Wack [facility] 01/24/2016, 5:29 PM

## 2016-01-24 NOTE — Consult Note (Signed)
  PSYCHOSOCIAL NOTE - CONFIDENTIAL  [facility]   Matthew CoveyJonathon [name] [facility].   He was previously seen by this provider. Information from that visit is as follows:   Overall, Mr. [name] denied suffering from any cognitive deficits and no overt cognitive issues were observed. Emotionally, he is adjusting fairly well to this admission though he is admittedly disgruntled with his physician provider as well as with his present circumstances in general. Time was spent normalizing his feelings. We discussed the possibility of his treatment team being more up front about his prognosis so that he can have realistic expectations about the future. He also requested a physician provider changed and I told him that I would touch base with the social worker on the topic. He also wishes to be able to go outside, which he hasn't been able to do secondary to security reasons. His social worker is following up with him regarding this topic.  Mr. [name] was seen today for brief follow-up for supportive psychotherapy and to assist with coping. He described his current mood as "alright" and mentioned that he has been in generally good spirits of late. He is no longer as concerned about getting outside and understands that it is for safety reasons. Although he had a personality conflict with his physician provider, this has been resolved and the patient is okay with the situation.  He continues to feel that progress is being made in therapy and he had no complaints today about any rehab staff members. He learned that he will not be transitioning back home and that he is in line for nursing home placement. He is not happy about this situation but he understands the need for it. He still regularly receives support from family and friends and appears to be coping well. From a cognitive perspective, Mr. [name] did not have any concerns and no overt  cognitive issues were observed today. At this time, I do not feel that neuropsychology needs to follow-up with the patient for the remainder of his admission unless requested by him or the rehab staff.   Greater than 50% of this visit was spent educating the patient about the possible diagnosis, prognosis, management plan, and in coordination of care.   Total professional time including medical record review and feedback equals 2 units [phone]([id number]).   Debbe MountsAdam T. Kaydence Menard, Psy.D., ABN Board-Certified Clinical Neuropsychologist

## 2016-01-24 NOTE — Progress Notes (Signed)
[facility]ICINE & REHABILITATION     PROGRESS NOTE    Subjective/Complaints: Pain fairly well controlled. Still having a lot of spasms especially in the morning.   ROS: Pt denies CP, SOB, nausea, vomiting, diarrhea.   Objective: Vital Signs: Blood pressure 119/66, pulse 60, temperature 98.1 F (36.7 C), temperature source Oral, resp. rate 17, height 5\' 7"  (1.702 m), weight 91.173 kg (201 lb), SpO2 100 %. No results found. No results for input(s): WBC, HGB, HCT, PLT in the last 72 hours. No results for input(s): NA, K, CL, GLUCOSE, BUN, CREATININE, CALCIUM in the last 72 hours.  Invalid input(s): CO CBG (last 3)  No results for input(s): GLUCAP in the last 72 hours.  Wt Readings from Last 3 Encounters:  01/10/16 91.173 kg (201 lb)  12/22/15 97.3 kg (214 lb 8.1 oz)    Physical Exam:  Constitutional: He appears well-developed and well-nourished. NAD. HENT: Normocephalic and atraumatic.  Eyes: Conjunctivae and EOM are normal.   Cardiovascular: Normal rate and regular rhythm.  Respiratory: Effort normal. No accessory muscle usage. No respiratory distress.    GI: Soft. Bowel sounds are normal. He exhibits no distension. There is no tenderness.  Musculoskeletal: He exhibits tenderness. He exhibits no edema.  Neurological: He is alert and oriented.  Speech clear.  B/l UE 5/5.  B/l LE: trace HF, Hip ABD, Hip ADD and KE/KF. No ADF/PF. 1-2/4 tone in hip add, quads, extensor tone. Sustained clonus with minimal manipulation of feet or knees. Skin: Skin is warm and dry.  Wounds clean and almost healed. Minimal remaining moist granulation Psychiatric: He has a normal mood and affect. His speech is normal and behavior is normal. He expresses impulsivity.    Assessment/Plan: 1. Paraparesis secondary to T1 SCI  which require 3+ hours per day of interdisciplinary therapy in a comprehensive inpatient rehab setting. Physiatrist is providing close team supervision and 24 hour  management of active medical problems listed below. Physiatrist and rehab team continue to assess barriers to discharge/monitor patient progress toward functional and medical goals.  Function:  Bathing Bathing position Bathing activity did not occur: Refused Position: Bed  Bathing parts Body parts bathed by patient: Front perineal area, Buttocks, Right upper leg, Left upper leg Body parts bathed by helper: Right lower leg, Left lower leg, Back  Bathing assist Assist Level: Touching or steadying assistance(Pt > 75%)   Set up : To obtain items  Upper Body Dressing/Undressing Upper body dressing Upper body dressing/undressing activity did not occur: Refused What is the patient wearing?: Pull over shirt/dress     Pull over shirt/dress - Perfomed by patient: Thread/unthread right sleeve, Thread/unthread left sleeve, Put head through opening Pull over shirt/dress - Perfomed by helper: Pull shirt over trunk        Upper body assist Assist Level: Set up   Set up : To obtain clothing/put away  Lower Body Dressing/Undressing Lower body dressing Lower body dressing/undressing activity did not occur: Refused What is the patient wearing?: Ted Hose, Non-skid slipper socks (ACE wraps- total A)     Pants- Performed by patient: Pull pants up/down Pants- Performed by helper: Thread/unthread right pants leg, Thread/unthread left pants leg, Pull pants up/down   Non-skid slipper socks- Performed by helper: Don/doff right sock, Don/doff left sock               TED Hose - Performed by helper: Don/doff right TED hose, Don/doff left TED hose  Lower body assist Assist for lower body dressing:  2 Helpers      Financial trader activity did not occur: Refused Toileting steps completed by patient: Adjust clothing prior to toileting, Performs perineal hygiene, Adjust clothing after toileting Toileting steps completed by helper: Adjust clothing prior to toileting, Performs perineal  hygiene, Adjust clothing after toileting    Toileting assist Assist level: More than reasonable time, Set up/obtain supplies, Touching or steadying assistance (Pt.75%), Two helpers   Transfers Chair/bed transfer   Chair/bed transfer method: Lateral scoot Chair/bed transfer assist level: Touching or steadying assistance (Pt > 75%) Chair/bed transfer assistive device: Sliding board, Armrests Mechanical lift: Maximove   Locomotion Ambulation Ambulation activity did not occur: Safety/medical concerns         Wheelchair   Type: Manual Max wheelchair distance: 150 Assist Level: No help, No cues, assistive device, takes more than reasonable amount of time  Cognition Comprehension Comprehension assist level: Follows complex conversation/direction with no assist  Expression Expression assist level: Expresses complex ideas: With no assist  Social Interaction Social Interaction assist level: Interacts appropriately with others - No medications needed.  Problem Solving Problem solving assist level: Solves complex problems: Recognizes & self-corrects  Memory Memory assist level: Complete Independence: No helper   Medical Problem List and Plan: 1. Paraplegia and functional deficits secondary to T1 SCI -continue therapies. SNF pending  -pt participating a bit more    2. DVT Prophylaxis/Anticoagulation: Pharmaceutical: Lovenox 3. Pain Management:  oxycodone prn--encouraged him to use less if pain is controlled  - On lyrica 75 mg bid for significant dysesthesias.   -oxycontin stopped 4. Mood: immature  5. Neuropsych: This patient is capable of making decisions on his own behalf. 6. Skin/Wound Care: Air mattress overlay for prevention of breakdown.   -wounds healing with granulation--can change to foam dressing only 7. Fluids/Electrolytes/Nutrition: Monitor I/O.  Push PO fluids 8. Hypokalemia: no labs collected due to refusal--  9. Right pical pleural hematoma:  Respiratory status stable.  10. Thrombocytopenia: Resolved. Monitor with Lovenox on board.  11. Neurogenic bladder:    -continue urecholine which has helped spontaneous voids. I/o cath prn  UA +/-, ucx multipsecies 12. Neurogenic bowel:continues to refuse suppository  -bid laxative    -sorbitol prn has been effective to keep him somewhat regular 13. Spasticity: ROM/splinting  -titrate baclofen to  qid  -consider klonopin trial for clonus      .   LOS (Days) 22 A FACE TO FACE EVALUATION WAS PERFORMED  SWARTZ,ZACHARY T 01/24/2016 9:00 AM

## 2016-01-24 NOTE — Progress Notes (Signed)
Social Work Patient ID: [name], male   DOB: [birth year], 25 y.o.   MRN: [id number]   Have reviewed team conference with pt who is also aware that I am continuing to work on securing a SNF bed.  He continues to offer little comment on plans other than to acknowledge that he doesn't "want" to go to SNF, however, he understands reasoning.  Will continue to follow.  Kayelyn Lemon, LCSW

## 2016-01-24 NOTE — Progress Notes (Signed)
Occupational Therapy Weekly Progress Note  Patient Details  Name: Matthew [name] MRN: [id number] Date of Birth: 30-Oct-1990  Beginning of progress report period: January 17, 2016 End of progress report period: January 24, 2016  Today's Date: 01/24/2016 OT Individual Time: 5638-9373 OT Individual Time Calculation (min): 60 min    Patient has met 3 of 3 short term goals.  Pt making steady progress towards OT goals, however, cont to require cues for engagement in therapy sessions, however does complete tasks asked by therapist. He cont to decline/ refuse LB bathing/dressing during sessions.   Patient continues to demonstrate the following deficits: abnormal posture, acute pain, muscle weakness (generalized), pain in thoracic spine and paraparesis at level T-1 and therefore will continue to benefit from skilled OT intervention to enhance overall performance with BADL, iADL and Reduce care partner burden.  Patient progressing toward long term goals..  Continue plan of care.  OT Short Term Goals Week 3:  OT Short Term Goal 1 (Week 3): Pt will complete 3/3 toileting steps in real or simulated toileting task with min A OT Short Term Goal 1 - Progress (Week 3): Met OT Short Term Goal 2 (Week 3): Pt will complete sliding board transfer w/[name] > therapy mat with set-up/ supervision to increase independence with functional tasks OT Short Term Goal 2 - Progress (Week 3): Met OT Short Term Goal 3 (Week 3): Pt will donn/ doff B socks with min A in circle sit position in prep for full  LB bathing/dressing task OT Short Term Goal 3 - Progress (Week 3): Met Week 4:  OT Short Term Goal 1 (Week 4): STG=LTG due to planned d/[name] to SNF  Skilled Therapeutic Interventions/Progress Updates:    Pt seen for OT session focusing on functional mobility, transfers, and ADL re-training. Pt asleep in supine upon arrival, easily awoken and agreeable to tx session. He declined bathing/dressing this session. While donning TED hose  and ACE wraps attempted to initiate conversation regarding visit from rep from SNF that visited yesterday, however, pt not engaging in conversation.  Pt attempted to walk LEs off EOB in prep for transfer, able to move LEs across bed several inches with increased time and effort. He requested use of leg loops to manage LEs remainder of way of bed. Min A sliding board transfer completed to w/[name] with VCs for LE management.  He completed grooming tasks at sink with set-up.  He self propelled w/[name] to therapy gym mod I with increased time. Completed sliding board transfer to/from therapy mat with assist to place board and 1 VC for management of LEs. Pt completed soccer ball kicks from elevated mat working on LE strengthening/ control. UE support required for dynamic sitting balance. Pt returned to room total A at end of session, left sitting in w/[name] with all needs in reach.   Therapy Documentation Precautions:  Precautions Precautions: Fall Precaution Comments: Monitor BP, ACEs, TEDs Restrictions Weight Bearing Restrictions: No RUE Weight Bearing: Non weight bearing Pain:   No/ denies pain  See Function Navigator for Current Functional Status.   Therapy/Group: Individual Therapy  Matthew [name] 01/24/2016, 7:04 AM

## 2016-01-25 ENCOUNTER — Inpatient Hospital Stay (HOSPITAL_COMMUNITY): Payer: Medicaid Other

## 2016-01-25 ENCOUNTER — Inpatient Hospital Stay (HOSPITAL_COMMUNITY): Payer: Medicaid Other | Admitting: Occupational Therapy

## 2016-01-25 ENCOUNTER — Inpatient Hospital Stay (HOSPITAL_COMMUNITY): Payer: Medicaid Other | Admitting: Physical Therapy

## 2016-01-25 ENCOUNTER — Inpatient Hospital Stay (HOSPITAL_COMMUNITY): Payer: Self-pay | Admitting: Occupational Therapy

## 2016-01-25 NOTE — Progress Notes (Signed)
Occupational Therapy Note  Patient Details  Name: Matthew [name] MRN: [id number] Date of Birth: April 15, 1991  Today's Date: 01/25/2016 OT Missed Time: 75 Minutes Missed Time Reason: Patient unwilling/refused to participate without medical reason  Pt in supine upon arrival. Matthew [name] reported "I'm just not doing therapy today, I want to be left alone". Pt refused to elaborate when therapist offered support. Made CSW aware of pt's behavior. Will attempt to make-up as schedule allows. Cont POC.    Melvyn NethLewis, Caliah Kopke C 01/25/2016, 12:16 PM

## 2016-01-25 NOTE — Progress Notes (Signed)
Physical Therapy Session Note  Patient Details  Name: Matthew [name] MRN: [id number] Date of Birth: 1991-06-30  Today's Date: 01/25/2016 PT Individual Time: [id number] PT Individual Time Calculation (min): 53 min   Short Term Goals: Week 3:  PT Short Term Goal 1 (Week 3): = LTGs overall supervision/mod I w/c level due to d/c plan now SNF  Skilled Therapeutic Interventions/Progress Updates:  Patient in bed upon arrival, agreeable to therapy after stating he had [name] long talk with his mother. Donned TED hose, ace wraps, and leg loops with total [name], performed LB dressing in bed with assist to thread BLE and to completely pull pants over hips, bed mobility retraining using bed rail with supervision, slide board transfers with assist to place board and steady assist bed > wheelchair <> NuStep, NuStep for neuro re-ed BUE/BLE at level 4 x 10 min with theraband to prevent BLE abduction, seated BLE PROM for heel cords, hamstrings, hip IR, and hip ER, and wheelchair propulsion in controlled environment and wheelchair parts management with mod I.    Therapy Documentation Precautions:  Precautions Precautions: Fall Precaution Comments: Monitor BP, ACEs, TEDs Restrictions Weight Bearing Restrictions: Yes RUE Weight Bearing: Weight bearing as tolerated Pain: Pain Assessment Pain Assessment: 0-10 Pain Score: 5  Pain Type: Acute pain Pain Location: Back Pain Descriptors / Indicators: Aching Pain Onset: On-going Pain Intervention(s): Repositioned  See Function Navigator for Current Functional Status.   Therapy/Group: Individual Therapy  Matthew [name], Matthew [name] 01/25/2016, 5:03 PM

## 2016-01-25 NOTE — Progress Notes (Signed)
Occupational Therapy Note  Patient Details  Name: Matthew [name] MRN: [id number] Date of Birth: 1991-03-28  Pt missed 60 mins OT session secondary to refusal.  Pt stating "I don't feel like it today." Therapist attempted to engage pt to find out how he's doing and what was bothering him but he stated "I don't want to talk about it, I just want to be by myself."  Nursing made aware of situation and missed therapy.   Piedad Standiford OTR/L 01/25/2016, 3:56 PM

## 2016-01-25 NOTE — Progress Notes (Signed)
Physical Therapy Weekly Progress Note  Patient Details  Name: Matthew [name] MRN: [id number] Date of Birth: [birth year]  Beginning of progress report period: January 19, 2016 End of progress report period: January 25, 2016  Patient has met 2 of 6 long term goals.  Short term goals not set due to awaiting d/c to SNF. Pt has been making good progress overall with mobility except he has been refusing therapies today. Pt is currently supervision to min assist overall and using slideboard for transfers.  Patient continues to demonstrate the following deficits: paraplegia, decreased balance, decreased endurance, decreased strength, impaired sensation, impaired tone, pain, and therefore will continue to benefit from skilled PT intervention to enhance overall performance with activity tolerance, balance, postural control, ability to compensate for deficits, functional use of  right upper extremity, right lower extremity, left upper extremity and left lower extremity and coordination.  See Patient's Care Plan for progression toward long term goals.  Patient progressing toward long term goals..  Continue plan of care.  Skilled Therapeutic Interventions/Progress Updates:  Financial risk analyst;Neuromuscular re-education;Psychosocial support;UE/LE Strength taining/ROM;Wheelchair propulsion/positioning;UE/LE Coordination activities;Therapeutic Activities;Functional electrical stimulation;Discharge planning;Balance/vestibular training;Functional mobility training;Patient/family education;Splinting/orthotics;Therapeutic Exercise;Pain management;Disease management/prevention;Skin care/wound management   Therapy Documentation Precautions:  Precautions Precautions: Fall Precaution Comments: Monitor BP, ACEs, TEDs Restrictions   See Function Navigator for Current Functional Status.   Canary Brim Ivory Broad, PT, DPT  01/25/2016, 4:00 PM

## 2016-01-25 NOTE — Progress Notes (Signed)
Matthew [name] PHYSICAL MEDICINE & REHABILITATION     PROGRESS NOTE    Subjective/Complaints: No new issues. Just waking up.   ROS: Pt denies CP, SOB, nausea, vomiting, diarrhea.   Objective: Vital Signs: Blood pressure 114/67, pulse 60, temperature 98.2 F (36.8 C), temperature source Oral, resp. rate 17, height 5\' 7"  (1.702 m), weight 91.173 kg (201 lb), SpO2 99 %. No results found. No results for input(s): WBC, HGB, HCT, PLT in the last 72 hours. No results for input(s): NA, K, CL, GLUCOSE, BUN, CREATININE, CALCIUM in the last 72 hours.  Invalid input(s): CO CBG (last 3)  No results for input(s): GLUCAP in the last 72 hours.  Wt Readings from Last 3 Encounters:  01/10/16 91.173 kg (201 lb)  12/22/15 97.3 kg (214 lb 8.1 oz)    Physical Exam:  Constitutional: He appears well-developed and well-nourished. NAD. HENT: Normocephalic and atraumatic.  Eyes: Conjunctivae and EOM are normal.   Cardiovascular: Normal rate and regular rhythm.  Respiratory: Effort normal. No accessory muscle usage. No respiratory distress.    GI: Soft. Bowel sounds are normal. He exhibits no distension. There is no tenderness.  Musculoskeletal: He exhibits tenderness. He exhibits no edema.  Neurological: He is alert and oriented.  Speech clear.  B/l UE 5/5.  B/l LE: trace HF, Hip ABD, Hip ADD and KE/KF. No ADF/PF. 1-2/4 tone in hip add, quads, extensor tone. Sustained clonus with minimal manipulation of feet or knees. Skin: Skin is warm and dry.  Wounds clean and almost healed. Minimal remaining moist granulation Psychiatric: He has a normal mood and affect. His speech is normal and behavior is normal. He expresses impulsivity.    Assessment/Plan: 1. Paraparesis secondary to T1 SCI  which require 3+ hours per day of interdisciplinary therapy in a comprehensive inpatient rehab setting. Physiatrist is providing close team supervision and 24 hour management of active medical problems listed  below. Physiatrist and rehab team continue to assess barriers to discharge/monitor patient progress toward functional and medical goals.  Function:  Bathing Bathing position Bathing activity did not occur: Refused Position: Bed  Bathing parts Body parts bathed by patient: Front perineal area, Buttocks, Right upper leg, Left upper leg Body parts bathed by helper: Right lower leg, Left lower leg, Back  Bathing assist Assist Level: Touching or steadying assistance(Pt > 75%)   Set up : To obtain items  Upper Body Dressing/Undressing Upper body dressing Upper body dressing/undressing activity did not occur: Refused What is the patient wearing?: Pull over shirt/dress     Pull over shirt/dress - Perfomed by patient: Thread/unthread right sleeve, Thread/unthread left sleeve, Put head through opening Pull over shirt/dress - Perfomed by helper: Pull shirt over trunk        Upper body assist Assist Level: Set up   Set up : To obtain clothing/put away  Lower Body Dressing/Undressing Lower body dressing Lower body dressing/undressing activity did not occur: Refused What is the patient wearing?: Ted Hose, Non-skid slipper socks (Refused changing pants)     Pants- Performed by patient: Pull pants up/down Pants- Performed by helper: Thread/unthread right pants leg, Thread/unthread left pants leg, Pull pants up/down   Non-skid slipper socks- Performed by helper: Don/doff right sock, Don/doff left sock               TED Hose - Performed by helper: Don/doff right TED hose, Don/doff left TED hose  Lower body assist Assist for lower body dressing: 2 Surveyor, mineralsHelpers      Toileting Toileting  Toileting activity did not occur: Refused Toileting steps completed by patient: Adjust clothing prior to toileting, Performs perineal hygiene, Adjust clothing after toileting Toileting steps completed by helper: Adjust clothing prior to toileting, Performs perineal hygiene, Adjust clothing after toileting     Toileting assist Assist level: More than reasonable time, Set up/obtain supplies, Touching or steadying assistance (Pt.75%), Two helpers   Transfers Chair/bed transfer Chair/bed transfer activity did not occur: Safety/medical concerns Chair/bed transfer method: Lateral scoot Chair/bed transfer assist level: Maximal assist (Pt 25 - 49%/lift and lower) Chair/bed transfer assistive device: Sliding board, Armrests Mechanical lift: Maximove   Locomotion Ambulation Ambulation activity did not occur: Safety/medical concerns         Wheelchair   Type: Manual Max wheelchair distance: 100 ft Assist Level: No help, No cues, assistive device, takes more than reasonable amount of time  Cognition Comprehension Comprehension assist level: Follows complex conversation/direction with no assist  Expression Expression assist level: Expresses complex ideas: With no assist  Social Interaction Social Interaction assist level: Interacts appropriately 75 - 89% of the time - Needs redirection for appropriate language or to initiate interaction.  Problem Solving Problem solving assist level: Solves basic 90% of the time/requires cueing < 10% of the time  Memory Memory assist level: Recognizes or recalls 90% of the time/requires cueing < 10% of the time   Medical Problem List and Plan: 1. Paraplegia and functional deficits secondary to T1 SCI -continue therapies. SNF pending  -pt participating generally    2. DVT Prophylaxis/Anticoagulation: Pharmaceutical: Lovenox 3. Pain Management:  oxycodone prn--encouraged him to use less if pain is controlled  - On lyrica 75 mg bid for significant dysesthesias.   -oxycontin stopped 4. Mood: immature  5. Neuropsych: This patient is capable of making decisions on his own behalf. 6. Skin/Wound Care: Air mattress overlay for prevention of breakdown.   -wounds healing with granulation--can change to foam dressing only 7.  Fluids/Electrolytes/Nutrition: Monitor I/O.  Push PO fluids 8. Hypokalemia: no labs collected due to refusal--  9. Right pical pleural hematoma: Respiratory status stable.  10. Thrombocytopenia: Resolved. Monitor with Lovenox on board.  11. Neurogenic bladder:    -continue urecholine which has helped spontaneous voids. I/o cath prn  UA +/-, ucx multipsecies 12. Neurogenic bowel:continues to refuse suppository  -bid laxative    -sorbitol prn has been effective to keep him somewhat regular 13. Spasticity: ROM/splinting  -titrated baclofen to 10mg  qid  -consider klonopin trial for clonus      .   LOS (Days) 23 A FACE TO FACE EVALUATION WAS PERFORMED  Matthew [name] T 01/25/2016 9:00 AM

## 2016-01-25 NOTE — Plan of Care (Signed)
Problem: RH Car Transfers Goal: LTG Patient will perform car transfers with assist (PT) LTG: Patient will perform car transfers with assistance (PT).  D/c due to SNF

## 2016-01-25 NOTE — Progress Notes (Signed)
Physical Therapy Note  Patient Details  Name: Matthew [name] MRN: [id number] Date of Birth: 07/26/1991 Today's Date: 01/25/2016    Pt missed 45 min of skilled PT session due to refusal. Pt initially did not respond to therapists questions about how his morning was going and kept his eyes closed. Spoke with OT and pt's RN and NT and attempted to ask him again about participating in session. Pt then stated "I'm just not doing it today." and that he didn't want to talk about it.      Karolee StampsGray, Matthew [name]  Matthew [name], PT, DPT 01/25/2016, 11:41 AM

## 2016-01-26 ENCOUNTER — Inpatient Hospital Stay (HOSPITAL_COMMUNITY): Payer: Medicaid Other

## 2016-01-26 ENCOUNTER — Inpatient Hospital Stay (HOSPITAL_COMMUNITY): Payer: Self-pay | Admitting: Physical Therapy

## 2016-01-26 ENCOUNTER — Inpatient Hospital Stay (HOSPITAL_COMMUNITY): Payer: Self-pay | Admitting: Occupational Therapy

## 2016-01-26 ENCOUNTER — Encounter (HOSPITAL_COMMUNITY): Payer: Self-pay

## 2016-01-26 NOTE — Progress Notes (Signed)
Physical Therapy Session Note  Patient Details  Name: Ginnie SmartJonathan XXXLyons MRN: [id number] Date of Birth: [birth year]  Today's Date: 01/26/2016 PT Individual Time: 0903-0951 PT Individual Time Calculation (min): 48 min   Short Term Goals: Week 4:  PT Short Term Goal 1 (Week 4): = LTGs overall supervision/mod I w/c level due to d/c plan now SNF  Skilled Therapeutic Interventions/Progress Updates:   Pt received in bed asleep; pt awakened easily.  Pt agreeable to stretching and then transferring to w/c for breakfast.  Performed bilat LE PROM and AAROM for tone management and ROM; during stretches pt verbalized frustrations and anger about finding out that he would not be leaving to go to another facility, not being able to D/C home ("what was the point of learning all this bullshit!") and the possibility of having to stay longer on CIR while still not being able to leave the unit or go outside due to security.  Acknowledged and validated pt's anger and frustrations about his situation, attempted to provide emotional support/encouragement and re-educated pt on purpose of protected identity for safety but discussed with pt it may be possible to have it removed if he is agreeable.  Pt stating that if he has to stay here longer while being restricted he will leave today!  Pt requesting to talk with social worker about removing protected identity.  Called in social worker to continued to discuss with pt security issues and D/C placement issues while therapist continued with stretches, donning TED hose, ACE wraps, socks and leg loops. Social worker to have MD discuss with pt.  Pt performed supine > sit with leg loops, bed rails Mod I.  Performed slideboard into w/c with min A to stabilize LE on ground.  Pt set up with breakfast and left with all items within reach.  Pt had calmed down but still asking when MD would be coming in to talk to him.    Therapy Documentation Precautions:  Precautions Precautions:  Fall Precaution Comments: Monitor BP, ACEs, TEDs Restrictions Weight Bearing Restrictions: Yes RUE Weight Bearing: Weight bearing as tolerated Pain: Pain Assessment Pain Assessment: No/denies pain Pain Score: 4  Pain Type: Acute pain Pain Location: Back Pain Orientation: Right Pain Descriptors / Indicators: Aching Pain Onset: On-going  See Function Navigator for Current Functional Status.   Therapy/Group: Individual Therapy  Edman CircleHall, Koby Pickup [facility] 01/26/2016, 12:45 PM

## 2016-01-26 NOTE — Progress Notes (Signed)
[facility] PHYSICAL MEDICINE & REHABILITATION     PROGRESS NOTE    Subjective/Complaints: No new problems. Sleeping and slow to awaken this morning.   ROS: Pt denies CP, SOB, nausea, vomiting, diarrhea.   Objective: Vital Signs: Blood pressure 114/67, pulse 60, temperature 98 F (36.7 C), temperature source Oral, resp. rate 18, height 5\' 7"  (1.702 m), weight 91.173 kg (201 lb), SpO2 99 %. No results found. No results for input(s): WBC, HGB, HCT, PLT in the last 72 hours. No results for input(s): NA, K, CL, GLUCOSE, BUN, CREATININE, CALCIUM in the last 72 hours.  Invalid input(s): CO CBG (last 3)  No results for input(s): GLUCAP in the last 72 hours.  Wt Readings from Last 3 Encounters:  01/10/16 91.173 kg (201 lb)  12/22/15 97.3 kg (214 lb 8.1 oz)    Physical Exam:  Constitutional: He appears well-developed and well-nourished. NAD. HENT: Normocephalic and atraumatic.  Eyes: Conjunctivae and EOM are normal.   Cardiovascular: Normal rate and regular rhythm.  Respiratory: Effort normal. No accessory muscle usage. No respiratory distress.    GI: Soft. Bowel sounds are normal. He exhibits no distension. There is no tenderness.  Musculoskeletal: He exhibits tenderness. He exhibits no edema.  Neurological: He is alert and oriented.  Speech clear.  B/l UE 5/5.  B/l LE: trace HF, Hip ABD, Hip ADD and KE/KF. No ADF/PF. 1-2/4 tone in hip add, quads, extensor tone. Sustained clonus with minimal manipulation of feet or knees. Skin: Skin is warm and dry.  Wounds clean and almost healed. Minimal remaining granulation Psychiatric: He has a normal mood and affect. His speech is normal and behavior is normal. He expresses impulsivity.    Assessment/Plan: 1. Paraparesis secondary to T1 SCI  which require 3+ hours per day of interdisciplinary therapy in a comprehensive inpatient rehab setting. Physiatrist is providing close team supervision and 24 hour management of active medical  problems listed below. Physiatrist and rehab team continue to assess barriers to discharge/monitor patient progress toward functional and medical goals.  Function:  Bathing Bathing position Bathing activity did not occur: Refused Position: Bed  Bathing parts Body parts bathed by patient: Front perineal area, Buttocks, Right upper leg, Left upper leg Body parts bathed by helper: Right lower leg, Left lower leg, Back  Bathing assist Assist Level: Touching or steadying assistance(Pt > 75%)   Set up : To obtain items  Upper Body Dressing/Undressing Upper body dressing Upper body dressing/undressing activity did not occur: Refused What is the patient wearing?: Pull over shirt/dress     Pull over shirt/dress - Perfomed by patient: Thread/unthread right sleeve, Thread/unthread left sleeve, Put head through opening Pull over shirt/dress - Perfomed by helper: Pull shirt over trunk        Upper body assist Assist Level: Set up   Set up : To obtain clothing/put away  Lower Body Dressing/Undressing Lower body dressing Lower body dressing/undressing activity did not occur: Refused What is the patient wearing?: Ted Hose, Non-skid slipper socks (Refused changing pants)     Pants- Performed by patient: Pull pants up/down Pants- Performed by helper: Thread/unthread right pants leg, Thread/unthread left pants leg, Pull pants up/down   Non-skid slipper socks- Performed by helper: Don/doff right sock, Don/doff left sock               TED Hose - Performed by helper: Don/doff right TED hose, Don/doff left TED hose  Lower body assist Assist for lower body dressing: 2 Helpers  Toileting Toileting Toileting activity did not occur: Refused Toileting steps completed by patient: Adjust clothing prior to toileting, Performs perineal hygiene, Adjust clothing after toileting Toileting steps completed by helper: Adjust clothing prior to toileting, Performs perineal hygiene, Adjust clothing after  toileting    Toileting assist Assist level: More than reasonable time, Set up/obtain supplies, Touching or steadying assistance (Pt.75%), Two helpers   Transfers Chair/bed transfer Chair/bed transfer activity did not occur: Safety/medical concerns Chair/bed transfer method: Lateral scoot Chair/bed transfer assist level: Touching or steadying assistance (Pt > 75%) Chair/bed transfer assistive device: Sliding board, Armrests Mechanical lift: Maximove   Locomotion Ambulation Ambulation activity did not occur: Safety/medical concerns         Wheelchair   Type: Manual Max wheelchair distance: 100 ft Assist Level: No help, No cues, assistive device, takes more than reasonable amount of time  Cognition Comprehension Comprehension assist level: Follows complex conversation/direction with no assist  Expression Expression assist level: Expresses complex ideas: With no assist  Social Interaction Social Interaction assist level: Interacts appropriately 75 - 89% of the time - Needs redirection for appropriate language or to initiate interaction.  Problem Solving Problem solving assist level: Solves basic 90% of the time/requires cueing < 10% of the time  Memory Memory assist level: Recognizes or recalls 90% of the time/requires cueing < 10% of the time   Medical Problem List and Plan: 1. Paraplegia and functional deficits secondary to T1 SCI -continue therapies. SNF pending  -pt participating more    2. DVT Prophylaxis/Anticoagulation: Pharmaceutical: Lovenox 3. Pain Management:  oxycodone prn--encouraged him to use less if pain is controlled  - On lyrica 75 mg bid for dysesthesias.   -oxycontin stopped 4. Mood: immature  5. Neuropsych: This patient is capable of making decisions on his own behalf. 6. Skin/Wound Care: Air mattress overlay for prevention of breakdown.   -wounds healing with granulation--can change to foam dressing only 7. Fluids/Electrolytes/Nutrition:  Monitor I/O.  Push PO fluids 8. Hypokalemia: no labs collected due to refusal--  9. Right pical pleural hematoma: Respiratory status stable.  10. Thrombocytopenia: Resolved. Monitor with Lovenox on board.  11. Neurogenic bladder:    -continue urecholine which has helped spontaneous voids. I/o cath prn  UA +/-, ucx multipsecies 12. Neurogenic bowel:continues to refuse suppository  -bid laxative    -sorbitol prn has been effective to keep him somewhat regular 13. Spasticity: ROM/splinting  -titrated baclofen to 10mg  qid  -consider klonopin trial for clonus      .   LOS (Days) 24 A FACE TO FACE EVALUATION WAS PERFORMED  [name],Matthew T 01/26/2016 9:19 AM

## 2016-01-26 NOTE — Plan of Care (Signed)
Problem: RH SKIN INTEGRITY Goal: RH STG SKIN FREE OF INFECTION/BREAKDOWN While in rehab with total assist  Outcome: Progressing Skin remains intact, no new breakdown, will continue to monitor.

## 2016-01-26 NOTE — Progress Notes (Signed)
Occupational Therapy Cancellation Notes  Patient Details  Name: Matthew [name] MRN: [id number] Date of Birth: [birth year]  Today's Date: 01/26/2016  Short Term Goals: Week 1:  OT Short Term Goal 1 (Week 1): Pt will complete BSC transfer with max A +1 with +2 for safety if needed OT Short Term Goal 1 - Progress (Week 1): Met OT Short Term Goal 2 (Week 1): Pt will dress UB with set-up assist.  OT Short Term Goal 2 - Progress (Week 1): Not met OT Short Term Goal 3 (Week 1): Pt will tolerate 3 consecutive hours up in chair in order to increase functional activity tolerance OT Short Term Goal 3 - Progress (Week 1): Met OT Short Term Goal 4 (Week 1): Pt will don pants with max A OT Short Term Goal 4 - Progress (Week 1): Not met   Week 2:  OT Short Term Goal 1 (Week 2): Pt will complete LB dressing in circle sit with mod A OT Short Term Goal 1 - Progress (Week 2): Not met OT Short Term Goal 2 (Week 2): Pt will position self in circle sit with min A in prep for LB dressing OT Short Term Goal 2 - Progress (Week 2): Met OT Short Term Goal 3 (Week 2): Pt will wash LE with min A OT Short Term Goal 3 - Progress (Week 2): Not met OT Short Term Goal 4 (Week 2): Pt will dress UB in w/c position with set-up  OT Short Term Goal 4 - Progress (Week 2): Met OT Short Term Goal 5 (Week 2): Pt will transfer to [facility] with max A +1 OT Short Term Goal 5 - Progress (Week 2): Met   Week 3:  OT Short Term Goal 1 (Week 3): Pt will complete 3/3 toileting steps in real or simulated toileting task with min A OT Short Term Goal 1 - Progress (Week 3): Met OT Short Term Goal 2 (Week 3): Pt will complete sliding board transfer w/c > therapy mat with set-up/ supervision to increase independence with functional tasks OT Short Term Goal 2 - Progress (Week 3): Met OT Short Term Goal 3 (Week 3): Pt will donn/ doff B socks with min A in circle sit position in prep for full  LB bathing/dressing task OT Short Term Goal 3 -  Progress (Week 3): Met   Week 4:  OT Short Term Goal 1 (Week 4): STG=LTG due to planned d/c to SNF  Skilled Therapeutic Interventions/Progress Updates:   Session #1 Patient missed 45 minutes of skilled occupational therapy secondary to refusal. Upon entering room first time, pt stated he was waiting to talk with Dr. Naaman Plummer and would not do any therapy until he talked to him. Dr. Naaman Plummer went to talk with patient and therapist attempted a second time, but pt refused stating "I'm not doing any therapy right now, I'm pissed". Left pt seated in w/c and provided some words of encouragement. Told patient this therapist would be back later this afternoon for therapy and patient nodded head. Notified RN of patient's missed therapy time.   Session #2 Pt missed 60 minutes of skilled occupational therapy. Upon entering room, pt out of room. According to RN, pt off unit with family member. RN and scheduling team notified of 60 minutes of missed skilled intervention.   Therapy Documentation Precautions:  Precautions Precautions: Fall Precaution Comments: Monitor BP, ACEs, TEDs Restrictions Weight Bearing Restrictions: Yes RUE Weight Bearing: Weight bearing as tolerated  Vital Signs: Therapy Vitals Temp: 98 F (  36.7 C) Temp Source: Oral Resp: 18 Oxygen Therapy SpO2: 99 % O2 Device: Not Delivered  See Function Navigator for Current Functional Status.  Chrys Racer , MS, OTR/L, CLT  01/26/2016, 7:59 AM

## 2016-01-26 NOTE — Progress Notes (Addendum)
Social Work Patient ID: Matthew SmartJonathan [name], male   DOB: 1991-08-05, 25 y.o.   MRN: [id number] After much discussion pt is being taken off triple x status. Security aware and no concerns and Mom has been contacted by staff RN-Carly and she was agreeable. MD made aware. Pt made aware not to miss therapies by being outside. Pt much happier being able to get out of his room and outside.

## 2016-01-26 NOTE — Progress Notes (Signed)
Physical Therapy Session Note  Patient Details  Name: Matthew [name] MRN: [id number] Date of Birth: [birth year]  Today's Date: 01/26/2016  Pt missed 60 min of skilled PT due to refusal. Pt states that he "just does not want to do therapy today." Educated on importance of continuing therapies and encouragement provided pt's progress but he continues to refuse.   See Function Navigator for Current Functional Status.  Karolee StampsGray, Matthew [name]  Matthew [name] B. Phoebe Marter, PT, DPT  01/26/2016, 12:09 PM

## 2016-01-27 ENCOUNTER — Inpatient Hospital Stay (HOSPITAL_COMMUNITY): Payer: Self-pay

## 2016-01-27 NOTE — Plan of Care (Signed)
Problem: SCI BOWEL ELIMINATION Goal: RH STG SCI MANAGE BOWEL WITH MEDICATION WITH ASSISTANCE STG SCI Manage bowel with medication with moderate assistance.  Outcome: Not Progressing Refuses bowel program

## 2016-01-27 NOTE — Plan of Care (Signed)
Problem: SCI BOWEL ELIMINATION Goal: RH STG MANAGE BOWEL WITH ASSISTANCE STG Manage Bowel with minimal Assistance.  Outcome: Progressing Refuses bowel prep but LBM 6/23
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
## 2016-01-28 ENCOUNTER — Inpatient Hospital Stay (HOSPITAL_COMMUNITY): Payer: Self-pay | Admitting: Physical Therapy

## 2016-01-28 MED ORDER — HYDROCERIN EX CREA
1.0000 "application " | TOPICAL_CREAM | Freq: Two times a day (BID) | CUTANEOUS | Status: DC
  Administered 2016-01-28 – 2016-01-29 (×2): 1 via TOPICAL
  Filled 2016-01-28: qty 113

## 2016-01-28 NOTE — Progress Notes (Signed)
01/28/16 0800 nursing Patient refuses morning meds claims wants to sleep.

## 2016-01-28 NOTE — Progress Notes (Signed)
Physical Therapy Session Note  Patient Details  Name: Donevin Sainsbury MRN: [id number] Date of Birth: [birth year]  Today's Date: 01/28/2016 PT Individual Time: 1300-1330 PT Individual Time Calculation (min): 30 min   Short Term Goals: Week 1:  PT Short Term Goal 1 (Week 1): Pt demonstrate supine<>sit with mod assist PT Short Term Goal 1 - Progress (Week 1): Met PT Short Term Goal 2 (Week 1): Pt will demonstrate dynamic sitting balance with mod assist PT Short Term Goal 2 - Progress (Week 1): Met PT Short Term Goal 3 (Week 1): Pt will demonstrate slide board transfer with +1 assist PT Short Term Goal 3 - Progress (Week 1): Met Week 2:  PT Short Term Goal 1 (Week 2): Pt will be able to progress transfers to overall min assist uneven surfaces PT Short Term Goal 1 - Progress (Week 2): Met PT Short Term Goal 2 (Week 2): Pt will be able to propel w/c x 150' with supervision to increase functional mobility and UE strength PT Short Term Goal 2 - Progress (Week 2): Met PT Short Term Goal 3 (Week 2): w/c evaluation will be initiated to prepare for discharge PT Short Term Goal 3 - Progress (Week 2): Discontinued (comment) (Pt now to D/C to SNF so no longer applicable) PT Short Term Goal 4 (Week 2): Pt will be able to initiate use of leg loops to manage BLE during mobility PT Short Term Goal 4 - Progress (Week 2): Met Week 3:  PT Short Term Goal 1 (Week 3): = LTGs overall supervision/mod I w/c level due to d/c plan now SNF Week 4:  PT Short Term Goal 1 (Week 4): = LTGs overall supervision/mod I w/c level due to d/c plan now SNF  Skilled Therapeutic Interventions/Progress Updates:   Pt demonstrates improvement increasing standing tolerance with lesser restrictive AD. Pt still limited in gait activities by LE weakness, but able to maintain SLS with therapist assist and facilitation while using walker.  Pt would continue to benefit from skilled PT services to increase functional mobility.  Therapy  Documentation Precautions:  Precautions Precautions: Fall Precaution Comments: Monitor BP, ACEs, TEDs Restrictions Weight Bearing Restrictions: Yes RUE Weight Bearing: Weight bearing as tolerated Pain: Pain Assessment Pain Assessment: 0-10 Pain Score: 7  Pain Location: Shoulder Pain Orientation: Right Mobility:  Min A slide board transfer, and Min A segmental squat pivot  Other Treatments:  Pt educated on rehab plan, safety in mobility, progressing mobility, and rehab progress. Pt performs transfers x5 in session. Pt performs anterior weight shift, marching, hip flexion AAROM 2x10. Pt performs B/L weight shifting, BLE advancement 2x10 with RW. Pt performs static standing with RW 1'x3.  See Function Navigator for Current Functional Status.   Therapy/Group: Individual Therapy  Monia Pouch 01/28/2016, 1:31 PM

## 2016-01-28 NOTE — Plan of Care (Signed)
Problem: SCI BOWEL ELIMINATION Goal: RH STG SCI MANAGE BOWEL PROGRAM W/ASSIST OR AS APPROPRIATE STG SCI Manage bowel program w/min assist or as appropriate.  Outcome: Not Progressing Refuses bowel program

## 2016-01-28 NOTE — Progress Notes (Signed)
Patient refused Ultram this morning.  States, " I don't want that.  I just want to sleep.  I'm ok."  VSS.  No signs of discomfort or distress.    Kelli HopeBarber, Laryah Neuser M
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
## 2016-01-29 ENCOUNTER — Inpatient Hospital Stay (HOSPITAL_COMMUNITY): Payer: Medicaid Other

## 2016-01-29 ENCOUNTER — Inpatient Hospital Stay (HOSPITAL_COMMUNITY): Payer: Medicaid Other | Admitting: Occupational Therapy

## 2016-01-29 ENCOUNTER — Inpatient Hospital Stay (HOSPITAL_COMMUNITY): Payer: Self-pay

## 2016-01-29 NOTE — Progress Notes (Signed)
Physical Therapy Note  Patient Details  Name: Matthew [name] MRN: [id number] Date of Birth: 05/22/1991 Today's Date: 01/29/2016   Pt missed 60 min of skilled PT due to refusal due to stating he is too tired and didn't feel like he could do it now. Stated he would be ready for our session at 1500. CSW and RN made aware of pt refusing again.    Karolee StampsGray, Lynise Porr Darrol PokeBrescia  Johnston Maddocks B. Emersyn Kotarski, PT, DPT  01/29/2016, 1:23 PM

## 2016-01-29 NOTE — Plan of Care (Signed)
  Pt's plan of care adjusted to QD after speaking with care team and discussed with MD as pt currently not participating in current therapy schedule with OT and PT while awaiting SNF placement. Pt with several refusals since end of last week.  Philip AspenAlison B. Keymoni Mccaster, PT, DPT

## 2016-01-29 NOTE — Progress Notes (Signed)
Physical Therapy Session Note  Patient Details  Name: Petra KubaJonathan Plascencia MRN: [id number] Date of Birth: 06-Apr-1991  Today's Date: 01/29/2016 PT Individual Time: 1500-1530 PT Individual Time Calculation (min): 30 min   Short Term Goals: Week 3:  PT Short Term Goal 1 (Week 3): = LTGs overall supervision/mod I w/c level due to d/c plan now SNF  Skilled Therapeutic Interventions/Progress Updates:    Pt was not ready as he stated he would be after refusal of other session earlier and needed to be woken up and encouraged to participate in session. Engaged in w/c propulsion down to therapy gym for functional strengthening and mobility with pt managing w/c parts to set-up for activity. Seated LE strengthening on Kinetron from w/c level on resistance of 70 cm/sec with pt activating musculature independently and completed multiple trials until fatigue.   Therapy Documentation Precautions:  Precautions Precautions: Fall Precaution Comments: Monitor BP, TEDs    Pain: No reports of pain.   See Function Navigator for Current Functional Status.   Therapy/Group: Individual Therapy  Karolee StampsGray, Suriah Peragine Darrol PokeBrescia  Andalyn Heckstall B. Osei Anger, PT, DPT  01/29/2016, 3:44 PM

## 2016-01-29 NOTE — Progress Notes (Signed)
Occupational Therapy Note  Patient Details  Name: Matthew [name] MRN: [id number] Date of Birth: Dec 15, 1990  Today's Date: 01/29/2016 OT Individual Time: 1130-1200 OT Individual Time Calculation (min): 30 min   Pt denied pain Individual Therapy  Pt asleep with head resting on bedside table upon arrival.  Pt agreeable to engaging in BUE therex with focus on increase RUE strengthening and core stability.  Pt performed PNF with 3 kg weighted ball (3 sets of 12) with extended rest breaks.  Pt instructed to complete exercises X 2 during the rest of the day while seated in w/c.  Pt verbalized understanding of purpose of exercise for BUE and core strengthening.    Matthew [name], Matthew [name] [facility] 01/29/2016, 12:13 PM

## 2016-01-29 NOTE — Progress Notes (Signed)
Occupational Therapy Session Note  Patient Details  Name: Matthew [name] MRN: [id number] Date of Birth: February 25, 1991  Today's Date: 01/29/2016 OT Individual Time: 1000-1100 OT Individual Time Calculation (min): 60 min    Short Term Goals: Week 4:  OT Short Term Goal 1 (Week 4): STG=LTG due to planned d/c to SNF  Skilled Therapeutic Interventions/Progress Updates:    Pt seen for OT session focusing on standing tolerance/ endurance and balance. Pt sitting up in w/c upon arrival, agreeable to tx session. He declined option to shower this morning.   Discussed with pt wanting to try new position/ technique for LB dressing (sitting EOB and using lateral leans vs circle sitting). When discussing this with pt and wanting to trial it sitting EOM, pt adamentaly refused working on it. He stated "We were only working on that when I was going home, now I'm not going home so I don't need to work on it." Reviewed role of OT, OT goals, and POC, however, pt cont to refuse wanting to "do something that's going to get me better and stronger, putting on my pants isn't going to get me better."  He self propelled w/c to gym mod I. He completed standing trials using STEADY, standing into steady with min-mod A. Completed multiple standing trials in STEADY. [employer] reaching task focusing on pt not using UEs for support/ weightbearing. Pt's R knee hyperextending during standing trials. Educated and demosntrated regarding knee control, pt's R knee began to buckle during standing trials when focusing on not hyperextending and when he began to fatigue. He tolerated at most 30 seconds during standing trials, trials lasting btwn 10-30 seconds with extended seated rest breaks required btwn trials.  Pt returned to room mod I, left with all needs in reach.   Therapy Documentation Precautions:  Precautions Precautions: Fall Precaution Comments: Monitor BP, ACEs, TEDs Restrictions Weight Bearing Restrictions: Yes RUE  Weight Bearing: Weight bearing as tolerated   See Function Navigator for Current Functional Status.   Therapy/Group: Individual Therapy  [name], Orman Matsumura C 01/29/2016, 7:16 AM

## 2016-01-29 NOTE — Progress Notes (Signed)
Physical Therapy Session Note  Patient Details  Name: Matthew [name] MRN: [id number] Date of Birth: 15-Jul-1991  Today's Date: 01/29/2016 PT Individual Time: 0900-0930 PT Individual Time Calculation (min): 30 min   Short Term Goals: Week 3:  PT Short Term Goal 1 (Week 3): = LTGs overall supervision/mod I w/c level due to d/c plan now SNF  Skilled Therapeutic Interventions/Progress Updates:    Session focused on supine BLE stretching and ROM to address muscle flexibility, decrease tone to aid with mobility, and ROM for transfers and ADLs. Completed ankle PF/DF stretching, hip/knee flexion/extension, hip abduction, hip IR/ER stretching and hamstring stretching. Pt on phone the whole session with minimal interaction with therapist. At the end asked to get OOB - RN in room administering medication and would assist patient into w/c. PT recommended trial today without ACE wraps on BLE.   Therapy Documentation Precautions:  Precautions Precautions: Fall Precaution Comments: Monitor BP, ACEs, TEDs  Pain: No complaints.   See Function Navigator for Current Functional Status.   Therapy/Group: Individual Therapy  Karolee StampsGray, Matthew [name] Darrol PokeBrescia  Aleyssa Pike B. Giannis Corpuz, PT, DPT  01/29/2016, 10:02 AM
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
## 2016-01-30 ENCOUNTER — Inpatient Hospital Stay (HOSPITAL_COMMUNITY): Payer: Medicaid Other

## 2016-01-30 ENCOUNTER — Inpatient Hospital Stay (HOSPITAL_COMMUNITY): Payer: Self-pay

## 2016-01-30 ENCOUNTER — Inpatient Hospital Stay (HOSPITAL_COMMUNITY): Payer: Medicaid Other | Admitting: Occupational Therapy

## 2016-01-30 MED ORDER — BISACODYL 10 MG RE SUPP: 10.0000 mg | Freq: Every day | RECTAL | Status: DC

## 2016-01-30 MED ORDER — TRAMADOL HCL 50 MG PO TABS: 50.0000 mg | ORAL_TABLET | Freq: Four times a day (QID) | ORAL | Status: AC | PRN

## 2016-01-30 MED ORDER — TRAMADOL HCL 50 MG PO TABS
50.0000 mg | ORAL_TABLET | Freq: Four times a day (QID) | ORAL | Status: DC
  Administered 2016-01-30: 50 mg via ORAL
  Filled 2016-01-30 (×3): qty 1

## 2016-01-30 MED ORDER — BACLOFEN 10 MG PO TABS: 10.0000 mg | ORAL_TABLET | Freq: Four times a day (QID) | ORAL | Status: DC

## 2016-01-30 MED ORDER — SENNOSIDES-DOCUSATE SODIUM 8.6-50 MG PO TABS: 2.0000 | ORAL_TABLET | Freq: Every day | ORAL | Status: DC

## 2016-01-30 MED ORDER — BETHANECHOL CHLORIDE 25 MG PO TABS: 25.0000 mg | ORAL_TABLET | Freq: Four times a day (QID) | ORAL | Status: DC

## 2016-01-30 MED ORDER — BOOST / RESOURCE BREEZE PO LIQD: 1.0000 | Freq: Three times a day (TID) | ORAL | Status: DC

## 2016-01-30 MED ORDER — PREGABALIN 75 MG PO CAPS
75.0000 mg | ORAL_CAPSULE | Freq: Two times a day (BID) | ORAL | Status: DC
Start: 2016-01-30 — End: 2020-03-14

## 2016-01-30 MED ORDER — POLYETHYLENE GLYCOL 3350 17 G PO PACK: 17.0000 g | PACK | Freq: Two times a day (BID) | ORAL | Status: DC

## 2016-01-30 MED ORDER — BACITRACIN ZINC 500 UNIT/GM EX OINT: TOPICAL_OINTMENT | Freq: Two times a day (BID) | CUTANEOUS | Status: DC

## 2016-01-30 MED ORDER — NAPROXEN 500 MG PO TABS: 500.0000 mg | ORAL_TABLET | Freq: Two times a day (BID) | ORAL | Status: DC

## 2016-01-30 MED ORDER — METHOCARBAMOL 500 MG PO TABS: 500.0000 mg | ORAL_TABLET | Freq: Four times a day (QID) | ORAL | Status: DC | PRN

## 2016-01-30 MED ORDER — HYDROCERIN EX CREA: 1.0000 "application " | TOPICAL_CREAM | Freq: Two times a day (BID) | CUTANEOUS | Status: DC

## 2016-01-30 MED ORDER — ENOXAPARIN SODIUM 30 MG/0.3ML ~~LOC~~ SOLN
30.0000 mg | Freq: Two times a day (BID) | SUBCUTANEOUS | Status: DC
Start: 2016-01-30 — End: 2020-03-14

## 2016-01-30 NOTE — Progress Notes (Addendum)
Social Work Patient ID: Matthew [name], male   DOB: [birth year], [age] y.o.   MRN: [id number]   Have received SNF bed offer from [facility].  Pt initially refusing to take bed because of distance and arguing with me about needing to get a SNF bed locally.  Explaining to pt that no local facility is offering a bed and that we must take this bed offer or he will need to d/c home.  Mother also given this information and she continues to tell me and pt that she cannot have pt at her home as she cannot meet his care needs.  Mother telling pt that he must take this bed offer.  Pt now nods "yes" [facility] but will not speak with me.  Will begin coordinating d/c transportation and plan.    Amada JupiterHOYLE, Najma Bozarth, LCSW   Have arranged w/c [facility]ena[facility] Niece[facility] transport for tomorrow at 10:00 am.

## 2016-01-30 NOTE — Progress Notes (Signed)
Physical Therapy Discharge Summary  Patient Details  Name: Matthew [name] MRN: [id number] Date of Birth: [birth year]   Patient has met 6 of 6 long term goals due to improved activity tolerance, improved balance, improved postural control, increased strength, decreased pain, ability to compensate for deficits, functional use of  right upper extremity, right lower extremity and left lower extremity and improved coordination.  Patient to discharge at a wheelchair level supervision to min assist using slideboard for transfers. Level of assist often depends on pt's level of participation. Family unable to provide level of care needed upon d/c, so pt to d/c to SNF for further rehab.  Reasons goals not met: n/a - all goals met at this time.   Recommendation:  Patient will benefit from ongoing skilled PT services in skilled nursing facility setting to continue to advance safe functional mobility, address ongoing impairments in balance, abnormal tone, decreased trunk control, decreased functional mobility, decreased strength, paraparesis, impaired sensation, and minimize fall risk.  Equipment: TBD at next venue of care. Pt issued leg loops while here on CIR.  Reasons for discharge: treatment goals met and discharge from hospital  Patient/family agrees with progress made and goals achieved: Yes  PT Discharge Precautions/Restrictions Precautions Precautions: Fall Precaution Comments: Tedhose; ACE wraps have been d/c for BP control Restrictions Weight Bearing Restrictions: No RUE Weight Bearing: Weight bearing as tolerated    Cognition Overall Cognitive Status: Within Functional Limits for tasks assessed Orientation Level: Oriented X4 Sensation Sensation Light Touch: Impaired Detail Light Touch Impaired Details: Impaired RLE;Impaired LLE Proprioception: Impaired Detail Proprioception Impaired Details: Impaired LLE;Impaired RLE Coordination Gross Motor Movements are Fluid and Coordinated:  No Motor  Motor Motor: Paraplegia;Abnormal tone;Abnormal postural alignment and control Motor - Discharge Observations: Pt with some motor recovery in BLE though still presents with incomplete pararesis and impaired trunk contro.     Trunk/Postural Assessment  Cervical Assessment Cervical Assessment: Within Functional Limits (maintains lateral flexion) Thoracic Assessment Thoracic Assessment: Exceptions to [facility] (flexible but maintains flexed posture) Lumbar Assessment Lumbar Assessment: Exceptions to [facility] (flexible posterior pelvic tilt) Postural Control Trunk Control: improved since admission; supervision without UE support  Balance Balance Balance Assessed: Yes Static Sitting Balance Static Sitting - Level of Assistance: 6: Modified independent (Device/Increase time) Dynamic Sitting Balance Dynamic Sitting - Level of Assistance: 5: Stand by assistance Static Standing Balance Static Standing - Level of Assistance: 2: Max assist Extremity Assessment   see OT summary for UE details.   RLE Assessment RLE Assessment: Exceptions to [facility] RLE Strength RLE Overall Strength Comments: trace at ankle; 2/5 knee; 2-/5 hip LLE Assessment LLE Assessment: Exceptions to [facility] LLE Strength LLE Overall Strength Comments: grossly 3-5   See Function Navigator for Current Functional Status.  Canary Brim Ivory Broad, PT, DPT  01/30/2016, 3:38 PM

## 2016-01-30 NOTE — Progress Notes (Signed)
Physical Therapy Session Note  Patient Details  Name: Matthew [name] MRN: [id number] Date of Birth: 10-13-1990  Today's Date: 01/30/2016 PT Individual Time: 1132-1156 PT Individual Time Calculation (min): 24 min   Short Term Goals: Week 3:  PT Short Term Goal 1 (Week 3): = LTGs overall supervision/mod I w/c level due to d/c plan now SNF  Skilled Therapeutic Interventions/Progress Updates:    Pt seen for 24 minutes make up time. Pt received in w/c & agreeable to PT, noting 7/10 pain in R shoulder. Session focused on w/c mobility off of unit, outside and over concrete ramps. Pt required min A for ascending ramp due to uneven concrete, otherwise pt mod I. Pt able to utilize buttons on door to independently open doors and propel through them. Educated pt to back onto elevator & pt able to demonstrate. At end of session pt left in room in w/c with all needs within reach & family present.  Therapy Documentation Precautions:  Precautions Precautions: Fall Precaution Comments: Monitor BP, ACEs, TEDs Restrictions Weight Bearing Restrictions: Yes RUE Weight Bearing: Weight bearing as tolerated  Pain: Pain Assessment Pain Assessment: 0-10 Pain Score: 7  Pain Location: Shoulder Pain Orientation: Right Pain Intervention(s):  (increased activity)    See Function Navigator for Current Functional Status.   Therapy/Group: Individual Therapy  Sandi MariscalVictoria M Miller 01/30/2016, 12:14 PM

## 2016-01-30 NOTE — Progress Notes (Signed)
Occupational Therapy Session Note  Patient Details  Name: Matthew [name] MRN: [id number] Date of Birth: 08/16/1990  Today's Date: 01/30/2016 OT Individual Time: 0930-1030 and 1400-1430 OT Individual Time Calculation (min): 60 min and 30 min   Short Term Goals: Week 4:  OT Short Term Goal 1 (Week 4): STG=LTG due to planned d/[name] to SNF  Skilled Therapeutic Interventions/Progress Updates:    Session One: Pt seen for OT session focusing on ADL re-training. Pt in supine upon arrival, with encouragement agreeable to tx session, however, throughout session had to redirect pt away from phone.  LE stretches performed while in supine as pt with increased tone and spasms in B LEs. Clonus like movements observed in B LEs when stretches performed.  Pt cont to refuse showers, wanting only to wash UB at sink. Reluctantly, pt completed LB dressing seated EOB. Education and demonstration provided regarding use of reacher. With reacher, he was able to thread B LEs into pants. Given option of lateral leans or use of STEADY to pull pants up. Pt opted to use STEADY. He stood in STEADY with steadying assist to pull pants up. UB bathing/dressing and grooming completed at sink with set-up. Pt demonstrating excellent dynamic sitting balance while on STEADY to complete functional task.   He then completed UE strengthening program seated in w/[name]. Exercises completed x2 sets of 10 reps with pt able to recall exercises taught in previous sessions, however, was not receptive to cues for proper form and technique offered by therapist. He was then taught new exercises using 5# dumbbell for chest press and tricep extension.  Attempted education and to engage pt in conversation during UE exercises, however, pt remained quiet and not making eye contact with therapist. Pt left seated in w/[name], all needs in reach.  Pt cont to require education and encouragement to fully participate in OT session. Stating throughout session "me putting on  pants isn't going to get me better, I'm not going home, I don't need to do this stuff". Encouragement and education provided with therapist pointing out that dressing EOB is addressing his dynamic sitting balance and sit <> stands, the same things we work on in the gym.   Session Two: Pt seen for OT session focusing on functional mobility and sit <> stands. Pt sitting up in w/[name] upon arrival, agreeable to tx session. He self propelled w/[name] to therapy gym mod I. He completed sliding board transfer to therapy mat with set-up to place board and supervision. He returned to supine on mat with min A for management of LEs while using leg loops. LE stretches performed while in supine and pt educated regarding importance of breaking up tone in prep for ADL tasks. He returned to sitting EOM mod I using leg loops and returned to w/[name] in same manner as described above.  Pt desiring to "work on standing and walking". In parallel bars, pt stood with min-mod A with B knees blocked pulling up on bars to come into upright standing. Pt able to stand with mod-max A and one UE support, alternating UE support on bars. Completed x2 trials, ~1 minute in length. Increased assist required for controlled sit back to w/[name] due to decreased decentic control. Pt returned to room mod I, left with all needs in reach.   Therapy Documentation Precautions:  Precautions Precautions: Fall Precaution Comments: Monitor BP, ACEs, TEDs Restrictions Weight Bearing Restrictions: No RUE Weight Bearing: Weight bearing as tolerated   See Function Navigator for Current Functional Status.  Therapy/Group: Individual Therapy  Matthew [name], Matthew [name] 01/30/2016, 6:44 AM

## 2016-01-30 NOTE — Progress Notes (Signed)
Occupational Therapy Discharge Summary  Patient Details  Name: Matthew [name] MRN: [id number] Date of Birth: 09-27-1990   Patient has met 8 of 8 long term goals due to improved activity tolerance, improved balance, postural control, ability to compensate for deficits and improved coordination.  Patient to discharge at [facility] Assist level.  Patient d/c to SNF.  Pt has met all goals, however, he has refused full bathing/dressing during OT sessions, however, is able to complete UB bathing/dressing set-up to mod I. He has demonstrated ability to meet all OT goals, however, continuously refuses to complete during OT sessions.  Pt requires increased time and encouragement to participate in all tasks due to behavior.   Recommendation:  Patient discharging to SNF as family unable to provide needed assist at d/c. Will cont to benefit from OT services to increase independence with ADLs/ IADLs  Equipment: To be determined at next venue of care  Reasons for discharge: treatment goals met and discharge from hospital  Patient/family agrees with progress made and goals achieved: Yes  OT Discharge Precautions/Restrictions  Precautions Precautions: Fall Precaution Comments: Tedhose; ACE wraps have been d/c for BP control Restrictions Weight Bearing Restrictions: No RUE Weight Bearing: Weight bearing as tolerated Vision/Perception  Vision- History Baseline Vision/History: No visual deficits Patient Visual Report: No change from baseline Vision- Assessment Vision Assessment?: No apparent visual deficits  Cognition Overall Cognitive Status: Within Functional Limits for tasks assessed Arousal/Alertness: Awake/alert Orientation Level: Oriented X4 Memory: Appears intact Awareness: Appears intact Problem Solving: Appears intact Safety/Judgment: Appears intact Sensation Sensation Light Touch: Impaired Detail Light Touch Impaired Details: Impaired RLE;Impaired LLE Proprioception: Impaired  Detail Proprioception Impaired Details: Impaired LLE;Impaired RLE Coordination Gross Motor Movements are Fluid and Coordinated: No Fine Motor Movements are Fluid and Coordinated: Yes Motor  Motor Motor: Paraplegia;Abnormal tone;Abnormal postural alignment and control Motor - Discharge Observations: Pt with some motor recvoery; however still present with paraperesis in B LEs and decreased trunk control Trunk/Postural Assessment  Cervical Assessment Cervical Assessment: Within Functional Limits Thoracic Assessment Thoracic Assessment: Exceptions to [facility] (Maintains flexed posture) Lumbar Assessment Lumbar Assessment: Exceptions to [facility] (Posterior pelvic tilt) Postural Control Postural Control: Deficits on evaluation Trunk Control: Much improved since admission; supervision- mod I during functional task in unsupported sitting  Balance Balance Balance Assessed: Yes Static Sitting Balance Static Sitting - Balance Support: Feet unsupported;No upper extremity supported Static Sitting - Level of Assistance: 6: Modified independent (Device/Increase time) Dynamic Sitting Balance Dynamic Sitting - Balance Support: Feet supported;During functional activity Dynamic Sitting - Level of Assistance: 5: Stand by assistance;6: Modified independent (Device/Increase time) Sitting balance - Comments: Sitting EOB to complete bathing/dressing Static Standing Balance Static Standing - Level of Assistance: 2: Max assist Extremity/Trunk Assessment RUE Assessment RUE Assessment: Within Functional Limits (Limited due to pain and guarding of UE) LUE Assessment LUE Assessment: Within Functional Limits   See Function Navigator for Current Functional Status.  Bobby Rumpf, Bahja Bence C 01/30/2016, 12:42 PM

## 2016-01-30 NOTE — Progress Notes (Signed)
Physical Therapy Session Note  Patient Details  Name: Matthew [name] MRN: [id number] Date of Birth: 08/05/1991  Today's Date: 01/30/2016 PT Individual Time: 1100-1130 PT Individual Time Calculation (min): 30 min   Short Term Goals: Week 4:  PT Short Term Goal 1 (Week 4): = LTGs overall supervision/mod I w/c level due to d/c plan now SNF  Skilled Therapeutic Interventions/Progress Updates:   Session focused on w/c mobility at mod I level for functional mobility training and pt managing parts independently and neuro re-ed for sit <> stands, standing balance, weightshifting, and stepping forward/back in parallel bars. Pt required max assist for neuro re-ed activities and able to advance RLE and LLE x 2 reps each trial (x 2 trials) demonstrating decreased coordination and control as well as increasing weightbearing to R side- pt with more difficulty managing RLE due to weakness.   Pt has progressed away from ACE wraps and only using Tedhose at this time.   Therapy Documentation Precautions:  Precautions Precautions: Fall Precaution Comments: Monitor BP, TEDs Restrictions Weight Bearing Restrictions: Yes RUE Weight Bearing: Weight bearing as tolerated   Pain: No reports of pain.    See Function Navigator for Current Functional Status.   Therapy/Group: Individual Therapy  Karolee StampsGray, Jadarian Mckay Darrol PokeBrescia  Dempsy Damiano B. Nakima Fluegge, PT, DPT  01/30/2016, 11:37 AM

## 2016-01-30 NOTE — Progress Notes (Signed)
[place] PHYSICAL MEDICINE & REHABILITATION     PROGRESS NOTE    Subjective/Complaints: Patient is tired this morning no complaints today. Denies any severe spasms in the lower limbs  ROS: Pt denies CP, SOB, nausea, vomiting, diarrhea.   Objective: Vital Signs: Blood pressure 130/80, pulse 85, temperature 98.5 F (36.9 C), temperature source Oral, resp. rate 18, height 5\' 7"  (1.702 m), weight 91.173 kg (201 lb), SpO2 100 %. No results found. No results for input(s): WBC, HGB, HCT, PLT in the last 72 hours. No results for input(s): NA, K, CL, GLUCOSE, BUN, CREATININE, CALCIUM in the last 72 hours.  Invalid input(s): CO CBG (last 3)  No results for input(s): GLUCAP in the last 72 hours.  Wt Readings from Last 3 Encounters:  01/10/16 91.173 kg (201 lb)  12/22/15 97.3 kg (214 lb 8.1 oz)    Physical Exam:  Constitutional: He appears well-developed and well-nourished. NAD. HENT: Normocephalic and atraumatic.  Eyes: Conjunctivae and EOM are normal.   Cardiovascular: Normal rate and regular rhythm.  Respiratory: Effort normal. No accessory muscle usage. No respiratory distress.    GI: Soft. Bowel sounds are normal. He exhibits no distension. There is no tenderness.  Musculoskeletal: He exhibits tenderness. He exhibits no edema.  Neurological: He is alert and oriented.  Speech clear.  B/l UE 5/5.  B/l LE: trace HF, Hip ABD, Hip ADD and KE/KF. No ADF/PF. 1-2/4 tone in hip add, quads, extensor tone. Sustained clonus with minimal manipulation of feet or knees. Skin: Skin is warm and dry. Flaky skin over toes  Psychiatric: He has a normal mood and affect. His speech is normal and behavior is normal. He expresses impulsivity.    Assessment/Plan: 1. Paraparesis secondary to T1 SCI  which require 3+ hours per day of interdisciplinary therapy in a comprehensive inpatient rehab setting. Physiatrist is providing close team supervision and 24 hour management of active medical problems  listed below. Physiatrist and rehab team continue to assess barriers to discharge/monitor patient progress toward functional and medical goals.  Function:  Bathing Bathing position Bathing activity did not occur: Refused Position: Other (comment) (Sitting on STEADY)  Bathing parts Body parts bathed by patient: Right arm, Left arm, Chest, Abdomen (Refuses LB bathing) Body parts bathed by helper: Right lower leg, Left lower leg, Back  Bathing assist Assist Level: Set up   Set up : To obtain items  Upper Body Dressing/Undressing Upper body dressing Upper body dressing/undressing activity did not occur: Refused What is the patient wearing?: Pull over shirt/dress     Pull over shirt/dress - Perfomed by patient: Thread/unthread right sleeve, Thread/unthread left sleeve, Put head through opening Pull over shirt/dress - Perfomed by helper: Pull shirt over trunk        Upper body assist Assist Level: Set up   Set up : To obtain clothing/put away  Lower Body Dressing/Undressing Lower body dressing Lower body dressing/undressing activity did not occur: Refused What is the patient wearing?: Pants, Ted Hose, Shoes     Pants- Performed by patient: Thread/unthread right pants leg, Thread/unthread left pants leg, Pull pants up/down Pants- Performed by helper: Thread/unthread right pants leg, Thread/unthread left pants leg, Pull pants up/down   Non-skid slipper socks- Performed by helper: Don/doff right sock, Don/doff left sock     Shoes - Performed by patient: Don/doff right shoe, Don/doff left shoe Shoes - Performed by helper: Fasten right, Fasten left       TED Hose - Performed by helper: Don/doff right TED  hose, Don/doff left TED hose  Lower body assist Assist for lower body dressing: Touching or steadying assistance (Pt > 75%) (Standing in STEADY)      Toileting Toileting Toileting activity did not occur: Refused Toileting steps completed by patient: Adjust clothing prior to  toileting, Performs perineal hygiene, Adjust clothing after toileting Toileting steps completed by helper: Adjust clothing prior to toileting, Performs perineal hygiene, Adjust clothing after toileting    Toileting assist Assist level: More than reasonable time, Set up/obtain supplies, Supervision or verbal cues (BSC)   Transfers Chair/bed transfer Chair/bed transfer activity did not occur: Safety/medical concerns Chair/bed transfer method: Lateral scoot Chair/bed transfer assist level: Supervision or verbal cues Chair/bed transfer assistive device: Sliding board, Armrests Mechanical lift: Maximove   Locomotion Ambulation Ambulation activity did not occur: Safety/medical concerns   Max distance: 1' Assist level: Maximal assist (Pt 25 - 49%)   Wheelchair   Type: Manual Max wheelchair distance: 300 ft Assist Level: Supervision or verbal cues  Cognition Comprehension Comprehension assist level: Follows complex conversation/direction with extra time/assistive device  Expression Expression assist level: Expresses complex ideas: With extra time/assistive device  Social Interaction Social Interaction assist level: Interacts appropriately with others - No medications needed.  Problem Solving Problem solving assist level: Solves complex problems: Recognizes & self-corrects  Memory Memory assist level: More than reasonable amount of time   Medical Problem List and Plan: 1. Incomplete Paraplegia and functional deficits secondary to T1 SCI, incomplete, team conference today please refer to conference notes-continue therapies. SNF pending  -pt participating more    2. DVT Prophylaxis/Anticoagulation: Pharmaceutical: Lovenox 3. Pain Management:  oxycodone prn--encouraged him to use less if pain is controlled  - On lyrica 75 mg bid for dysesthesias.   -oxycontin stopped 4. Mood: immature  5. Neuropsych: This patient is capable of making decisions on his own behalf. 6.  Skin/Wound Care: Air mattress overlay for prevention of breakdown.   -wounds healing with granulation--can change to foam dressing only, add eucerin for feet 7. Fluids/Electrolytes/Nutrition: Monitor I/O.  Push PO fluids 8. Hypokalemia: no labs collected due to refusal--  9. Right pical pleural hematoma: Respiratory status stable.  10. Thrombocytopenia: Resolved. Monitor with Lovenox on board.  11. Neurogenic bladder:    -continent voids  UA +/-, ucx multipsecies 12. Neurogenic bowel:continent  -bid laxative    13. Spasticity: ROM/splinting  -titrated baclofen to 10mg  qid, still with LE extensor spasms but not bothering pt, cont current dose, Pt doesn't wish to increase dose  -consider klonopin trial for clonus 14.  R shoulder pain- reviewed xray no bullet frag near joint , likely unrelated, may be due to overuse with WC activities   .   LOS (Days) 28 A FACE TO FACE EVALUATION WAS PERFORMED  Erick ColaceKIRSTEINS,Ilyanna Baillargeon E 01/30/2016 5:34 PM
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
## 2016-01-31 ENCOUNTER — Inpatient Hospital Stay (HOSPITAL_COMMUNITY): Payer: Self-pay | Admitting: Occupational Therapy
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
## 2016-01-31 NOTE — Progress Notes (Signed)
Report called to Tresa EndoKelly, LPN at [facility]. Patient transported by wheelchair Zenaida Niecevan to [facility]. Patient belongings sent with patient.

## 2016-01-31 NOTE — Progress Notes (Signed)
Patient refuses all meds through out the shift. RN tried to talk to him but he said he didn't want to hear it. Multiple attempts were made but he kept refusing it.

## 2016-01-31 NOTE — Progress Notes (Signed)
Social Work  Discharge Note  The overall goal for the admission was met for:   Discharge location: No - plan changed to SNF as mother does not feel she can provide LOC needed  Length of Stay: No - extended slightly due to SNF bed search with Medicaid pending  Discharge activity level: Yes - min assist w/c level  Home/community participation: Yes  Services provided included: MD, RD, PT, OT, RN, TR, Pharmacy, Neuropsych and SW  Financial Services: MA and SSD apps pending  Follow-up services arranged: Other: SNF at [facility]  Comments (or additional information): transport via [facility]  Patient/Family verbalized understanding of follow-up arrangements: Yes  Individual responsible for coordination of the follow-up plan: pt  Confirmed correct DME delivered: NA    Matthew [name]

## 2016-02-01 ENCOUNTER — Telehealth: Payer: Self-pay

## 2016-02-01 NOTE — Telephone Encounter (Signed)
Made Gaynell FaceMarshall (scheduler) at [facility] aware of appointment on 02/07/16 @ 8:45 am.

## 2016-02-07 ENCOUNTER — Encounter: Payer: Medicaid Other | Attending: Physical Medicine & Rehabilitation | Admitting: Physical Medicine & Rehabilitation
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
## 2019-06-11 ENCOUNTER — Encounter (HOSPITAL_BASED_OUTPATIENT_CLINIC_OR_DEPARTMENT_OTHER): Payer: Self-pay

## 2019-06-11 ENCOUNTER — Emergency Department (HOSPITAL_BASED_OUTPATIENT_CLINIC_OR_DEPARTMENT_OTHER)
Admission: EM | Admit: 2019-06-11 | Discharge: 2019-06-11 | Disposition: A | Discharge status: Home / Self Care | Payer: Medicaid Other | Attending: Emergency Medicine | Admitting: Emergency Medicine

## 2019-06-11 ENCOUNTER — Other Ambulatory Visit: Payer: Self-pay

## 2019-06-11 DIAGNOSIS — F17210 Nicotine dependence, cigarettes, uncomplicated: Secondary | ICD-10-CM | POA: Diagnosis not present

## 2019-06-11 DIAGNOSIS — Z7901 Long term (current) use of anticoagulants: Secondary | ICD-10-CM | POA: Diagnosis not present

## 2019-06-11 DIAGNOSIS — Z202 Contact with and (suspected) exposure to infections with a predominantly sexual mode of transmission: Secondary | ICD-10-CM | POA: Insufficient documentation

## 2019-06-11 DIAGNOSIS — Z79899 Other long term (current) drug therapy: Secondary | ICD-10-CM | POA: Diagnosis not present

## 2019-06-11 LAB — HIV ANTIBODY (ROUTINE TESTING W REFLEX): HIV Screen 4th Generation wRfx: NONREACTIVE

## 2019-06-11 MED ORDER — AZITHROMYCIN 1 G PO PACK
1.0000 g | PACK | Freq: Once | ORAL | Status: AC
  Administered 2019-06-11: 1 g via ORAL
  Filled 2019-06-11: qty 1

## 2019-06-11 MED ORDER — CEFTRIAXONE SODIUM 250 MG IJ SOLR
250.0000 mg | Freq: Once | INTRAMUSCULAR | Status: AC
  Administered 2019-06-11: 250 mg via INTRAMUSCULAR
  Filled 2019-06-11: qty 250

## 2019-06-11 MED ORDER — METRONIDAZOLE 500 MG PO TABS
2000.0000 mg | ORAL_TABLET | Freq: Once | ORAL | Status: AC
  Administered 2019-06-11: 15:00:00 2000 mg via ORAL
  Filled 2019-06-11: qty 4

## 2019-06-11 MED ORDER — LIDOCAINE HCL (PF) 1 % IJ SOLN
INTRAMUSCULAR | Status: AC
  Administered 2019-06-11: 15:00:00 5 mL
  Filled 2019-06-11: qty 5

## 2019-06-11 NOTE — ED Notes (Signed)
A partner has been pos for gonorrhea  Wants to be checked  Denies any symptoms

## 2019-06-11 NOTE — ED Triage Notes (Signed)
Pt requesting STD check due to +STD exposure-denies sx-NAD-steady gait

## 2019-06-11 NOTE — ED Notes (Signed)
Pt. adamant about leaving and wanted d/c papers would not wait on vitals to be done.  RN talked Pt. Into getting blood work so he wwould not have to return for that part of treatment.

## 2019-06-11 NOTE — Discharge Instructions (Signed)
You have been treated for gonorrhea and chlamydia and trichomonas in the ER but the hospital will call you if lab is positive. You were tested for HIV and Syphilis, and the hospital will call you if the lab is positive.   NO SEXUAL INTERCOURSE FOR AT LEAST 10 DAYS AFTER TODAY'S VISIT, THIS WILL INVALIDATE YOUR TREATMENT HERE. DO NOT ENGAGE IN SEXUAL ACTIVITY UNTIL YOU FIND OUT ABOUT YOUR RESULTS AND HAVE PARTNERS TESTED AND TREATED. ALL PARTNERS MUST BE TESTED AND TREATED FOR STD'S. ALWAYS USE CONDOMS WHEN ENGAGING IN INTERCOURSE.   Follow up with [facility] STD clinic for future STD concerns or screenings.  Follow up with your regular doctor in 1 week for recheck of symptoms.
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
## 2019-06-11 NOTE — ED Notes (Signed)
Pt unable to provide urine sample at this time 

## 2019-06-12 LAB — RPR
RPR Ser Ql: REACTIVE — AB
RPR Titer: 1:4 {titer}

## 2019-06-14 LAB — T.PALLIDUM AB, TOTAL: T Pallidum Abs: REACTIVE — AB

## 2019-06-15 ENCOUNTER — Telehealth (HOSPITAL_BASED_OUTPATIENT_CLINIC_OR_DEPARTMENT_OTHER): Payer: Self-pay | Admitting: Emergency Medicine

## 2019-06-15 LAB — GC/CHLAMYDIA PROBE AMP (~~LOC~~) NOT AT ARMC
Chlamydia: NEGATIVE
Neisseria Gonorrhea: POSITIVE — AB

## 2019-06-18 ENCOUNTER — Encounter (HOSPITAL_BASED_OUTPATIENT_CLINIC_OR_DEPARTMENT_OTHER): Payer: Self-pay | Admitting: *Deleted

## 2019-06-18 ENCOUNTER — Other Ambulatory Visit: Payer: Self-pay

## 2019-06-18 ENCOUNTER — Emergency Department (HOSPITAL_BASED_OUTPATIENT_CLINIC_OR_DEPARTMENT_OTHER)
Admission: EM | Admit: 2019-06-18 | Discharge: 2019-06-18 | Disposition: A | Discharge status: Home / Self Care | Payer: Medicaid Other | Attending: Emergency Medicine | Admitting: Emergency Medicine

## 2019-06-18 DIAGNOSIS — F17210 Nicotine dependence, cigarettes, uncomplicated: Secondary | ICD-10-CM | POA: Diagnosis not present

## 2019-06-18 DIAGNOSIS — A539 Syphilis, unspecified: Secondary | ICD-10-CM | POA: Diagnosis present

## 2019-06-18 MED ORDER — DOXYCYCLINE HYCLATE 100 MG PO TABS
100.0000 mg | ORAL_TABLET | Freq: Once | ORAL | Status: AC
  Administered 2019-06-18: 12:00:00 100 mg via ORAL
  Filled 2019-06-18: qty 1

## 2019-06-18 MED ORDER — DOXYCYCLINE HYCLATE 100 MG PO CAPS: 100.0000 mg | ORAL_CAPSULE | Freq: Two times a day (BID) | ORAL | 0 refills | Status: DC

## 2019-06-18 MED FILL — DOXYCYCLINE HYCLATE 100 MG: 100 | 14 days supply | Qty: 28 | Fill #0
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
## 2019-06-18 NOTE — ED Triage Notes (Signed)
He was seen last week and treated for an STD. States he received a call stating he has syphilis and needs treatment.

## 2019-08-12 NOTE — Discharge Instructions (Signed)
Scheduled Meds:  Continuous Infusions:  PRN Meds:

## 2020-03-14 ENCOUNTER — Emergency Department (HOSPITAL_BASED_OUTPATIENT_CLINIC_OR_DEPARTMENT_OTHER)
Admission: EM | Admit: 2020-03-14 | Discharge: 2020-03-14 | Disposition: A | Discharge status: Home / Self Care | Payer: Medicaid Other | Attending: Emergency Medicine | Admitting: Emergency Medicine

## 2020-03-14 ENCOUNTER — Other Ambulatory Visit: Payer: Self-pay

## 2020-03-14 ENCOUNTER — Encounter (HOSPITAL_BASED_OUTPATIENT_CLINIC_OR_DEPARTMENT_OTHER): Payer: Self-pay | Admitting: *Deleted

## 2020-03-14 DIAGNOSIS — Z0000 Encounter for general adult medical examination without abnormal findings: Secondary | ICD-10-CM | POA: Insufficient documentation

## 2020-03-14 DIAGNOSIS — F17210 Nicotine dependence, cigarettes, uncomplicated: Secondary | ICD-10-CM | POA: Diagnosis not present

## 2020-03-14 DIAGNOSIS — Z202 Contact with and (suspected) exposure to infections with a predominantly sexual mode of transmission: Secondary | ICD-10-CM | POA: Diagnosis present

## 2020-03-14 NOTE — Discharge Instructions (Addendum)
Please go to the health department for further STD evaluation.  Results of your gonorrhea and chlamydia tests are pending and you will be notified if they are positive. Please refrain from intercourse for 7 days and until all sex partners (within previous 60 days) are evaluated and/or treated as well. Please follow up with your primary care provider for continued care and further STD evaluation.  It is very important to practice safe sex and use condoms when sexually active. If your results are positive you need to notify all sexual partners so they can be treated as well. The website [email or web address] can be used to send anonymous text messages or emails to alert sexual contacts. Follow up with your doctor, or OBGYN in regards to today's visit.   Gonorrhea and Chlamydia SYMPTOMS  In females, symptoms may go unnoticed. Symptoms that are more noticeable can include:  Belly (abdominal) pain.  Painful intercourse.  Watery mucous-like discharge from the vagina.  Miscarriage.  Discomfort when urinating.  Inflammation of the rectum.  Abnormal gray-green frothy vaginal discharge  Vaginal itching and irritatio  Itching and irritation of the area outside the vagina.   Painful urination.  Bleeding after sexual intercourse.  In males, symptoms include:  Burning with urination.  Pain in the testicles.  Watery mucous-like discharge from the penis.  It can cause longstanding (chronic) pelvic pain after frequent infections.  TREATMENT  PID can cause women to not be able to have children (sterile) if left untreated or if half-treated.  It is important to finish ALL medications given to you.  This is a sexually transmitted infection. So you are also at risk for other sexually transmitted diseases, including HIV (AIDS), it is recommended that you get tested. HOME CARE INSTRUCTIONS  Warning: This infection is contagious. Do not have sex until treatment is completed. Follow up at your caregiver's  office or the clinic to which you were referred. If your diagnosis (learning what is wrong) is confirmed by culture or some other method, your recent sexual contacts need treatment. Even if they are symptom free or have a negative culture or evaluation, they should be treated.  PREVENTION  Women should use sanitary pads instead of tampons for vaginal discharge.  Wipe front to back after using the toilet and avoid douching.   Practice safe sex, use condoms, have only one sex partner and be sure your sex partner is not having sex with others.  Ask your caregiver to test you for chlamydia at your regular checkups or sooner if you are having symptoms.  Ask for further information if you are pregnant.  SEEK IMMEDIATE MEDICAL CARE IF:  You develop an oral temperature above 102 F (38.9 C), not controlled by medications or lasting more than 2 days.  You develop an increase in pain.  You develop any type of abnormal discharge.  You develop vaginal bleeding and it is not time for your period.  You develop painful intercourse.   Bacterial Vaginosis  Bacterial vaginosis (BV) is a vaginal infection where the normal balance of bacteria in the vagina is disrupted. This is not a sexually transmitted disease and your sexual partners do NOT need to be treated. CAUSES  The cause of BV is not fully understood. BV develops when there is an increase or imbalance of harmful bacteria.  Some activities or behaviors can upset the normal balance of bacteria in the vagina and put women at increased risk including:  Having a new sex partner or multiple sex partners.  Douching.  Using an intrauterine device (IUD) for contraception.  It is not clear what role sexual activity plays in the development of BV. However, women that have never had sexual intercourse are rarely infected with BV.  Women do not get BV from toilet seats, bedding, swimming pools or from touching objects around them.   SYMPTOMS  Grey vaginal  discharge.  A fish-like odor with discharge, especially after sexual intercourse.  Itching or burning of the vagina and vulva.  Burning or pain with urination.  Some women have no signs or symptoms at all.   TREATMENT  Sometimes BV will clear up without treatment.  BV may be treated with antibiotics.  BV can recur after treatment. If this happens, a second round of antibiotics will often be prescribed.  HOME CARE INSTRUCTIONS  Finish all medication as directed by your caregiver.  Do not have sex until treatment is completed.  Do NOT drink any alcoholic beverages while being treated  with Metronidazole (Flagyl). This will cause a severe reaction inducing vomiting.  RESOURCE GUIDE  Dental Problems  Patients with Medicaid: [facility] [phone] [address]                                           [phone] Phone:  [phone]                                                  Phone:  [phone]  If unable to pay or uninsured, contact:  [facility] or [facility]. to become qualified for the adult dental clinic.  Chronic Pain Problems Contact [facility]  [phone] Patients need to be referred by their primary care doctor.  Insufficient Money for Medicine Contact [employer]:  call "211" or [facility] 801-719-8157.  No Primary Care Doctor Call [facility]  [phone] Other agencies that provide inexpensive medical care    [facility]  [phone]    [facility]  [phone]    [facility]  [phone]    [facility]  [phone]    [facility]  [phone]    [facility]  [phone]  Psychological Services [facility]  [phone] [facility] Services  [phone] [facility] Mental Health   [phone] (emergency services [phone])  Substance Abuse Resources [facility]  917-404-5928 [facility] [phone] [facility] [place] [phone] Floydene Flock [phone] Residential & Outpatient Substance Abuse Program  276-642-1997  Abuse/Neglect [facility] Child Abuse Hotline (989)537-6044 [place] Child Abuse Hotline 832-568-7477 (After Hours)  Emergency Shelter [facility] [phone]  Maternity Homes [facility] [place] [facility] [phone] [facility] [phone]  MRSA Hotline #:   212-313-1877    [facility] Resources  [facility]     [employer]                          [facility] Dept. [address] [place]  [address]      [address]  Blondell Reveal Phone:  [phone]                                   Phone:  [phone]                 Phone:  [phone]  [facility] Health Phone:  [phone]  [facility] Child Abuse Hotline 867-151-8639 [phone] (After Hours)

## 2020-03-14 NOTE — ED Triage Notes (Signed)
STD check
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
## 2024-03-26 ENCOUNTER — Emergency Department (HOSPITAL_BASED_OUTPATIENT_CLINIC_OR_DEPARTMENT_OTHER)
Admission: EM | Admit: 2024-03-26 | Discharge: 2024-03-26 | Disposition: A | Discharge status: Home / Self Care | Attending: Emergency Medicine | Admitting: Emergency Medicine

## 2024-03-26 ENCOUNTER — Other Ambulatory Visit: Payer: Self-pay

## 2024-03-26 ENCOUNTER — Encounter (HOSPITAL_BASED_OUTPATIENT_CLINIC_OR_DEPARTMENT_OTHER): Payer: Self-pay | Admitting: Emergency Medicine

## 2024-03-26 DIAGNOSIS — Z202 Contact with and (suspected) exposure to infections with a predominantly sexual mode of transmission: Secondary | ICD-10-CM | POA: Diagnosis present

## 2024-03-26 DIAGNOSIS — Z711 Person with feared health complaint in whom no diagnosis is made: Secondary | ICD-10-CM

## 2024-03-26 LAB — HIV ANTIBODY (ROUTINE TESTING W REFLEX): HIV Screen 4th Generation wRfx: NONREACTIVE

## 2024-03-26 NOTE — Discharge Instructions (Signed)
 Please follow up with your doctor as soon as possible regarding today's ED visit and your symptoms. Follow up with your MyChart app to get test results in the coming week.   Return to the ED if your pain worsens, you develop a fever, or for any other symptoms that concern you.
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
## 2024-03-26 NOTE — ED Notes (Signed)
 Unable to obtain labs in triage, tried stick X2, patient said can be a difficult stick

## 2024-03-26 NOTE — ED Triage Notes (Signed)
 Pt requesting STI screening.  Denies discharge, dysuria, irritation.

## 2024-03-27 LAB — RPR
RPR Ser Ql: REACTIVE — AB
RPR Titer: 1:4 {titer}

## 2024-03-29 LAB — GC/CHLAMYDIA PROBE AMP (~~LOC~~) NOT AT ARMC
Chlamydia: NEGATIVE
Comment: NEGATIVE
Comment: NORMAL
Neisseria Gonorrhea: NEGATIVE

## 2024-03-29 LAB — T.PALLIDUM AB, TOTAL: T Pallidum Abs: REACTIVE — AB
